# Patient Record
Sex: Female | Born: 1985 | Race: Black or African American | Hispanic: No | Marital: Single | Smoking: Current every day smoker
Health system: Southern US, Community
[De-identification: ages and names within clinical notes are randomized; demographics above are authoritative.]

## PROBLEM LIST (undated history)

## (undated) DIAGNOSIS — F419 Anxiety disorder, unspecified: Secondary | ICD-10-CM

## (undated) DIAGNOSIS — F1020 Alcohol dependence, uncomplicated: Secondary | ICD-10-CM

## (undated) HISTORY — PX: DILATION AND CURETTAGE OF UTERUS: SHX78

---
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
## 2009-04-15 ENCOUNTER — Emergency Department (HOSPITAL_COMMUNITY): Admission: EM | Admit: 2009-04-15 | Discharge: 2009-04-15 | Payer: Self-pay | Admitting: Emergency Medicine

## 2010-09-01 LAB — URINALYSIS, ROUTINE W REFLEX MICROSCOPIC
Bilirubin Urine: NEGATIVE
Glucose, UA: NEGATIVE mg/dL
Hgb urine dipstick: NEGATIVE
Ketones, ur: NEGATIVE mg/dL
Nitrite: NEGATIVE
Protein, ur: NEGATIVE mg/dL
Specific Gravity, Urine: 1.034 — ABNORMAL HIGH (ref 1.005–1.030)
Urobilinogen, UA: 1 mg/dL (ref 0.0–1.0)
pH: 7 (ref 5.0–8.0)

## 2010-09-01 LAB — CBC
HCT: 40.3 % (ref 36.0–46.0)
Hemoglobin: 13.4 g/dL (ref 12.0–15.0)
MCHC: 33.2 g/dL (ref 30.0–36.0)
MCV: 90.2 fL (ref 78.0–100.0)
Platelets: 241 10*3/uL (ref 150–400)
RBC: 4.47 MIL/uL (ref 3.87–5.11)
RDW: 12.8 % (ref 11.5–15.5)
WBC: 12.3 10*3/uL — ABNORMAL HIGH (ref 4.0–10.5)

## 2010-09-01 LAB — DIFFERENTIAL
Basophils Absolute: 0 10*3/uL (ref 0.0–0.1)
Basophils Relative: 0 % (ref 0–1)
Eosinophils Absolute: 0.1 10*3/uL (ref 0.0–0.7)
Eosinophils Relative: 1 % (ref 0–5)
Neutrophils Relative %: 87 % — ABNORMAL HIGH (ref 43–77)

## 2010-09-01 LAB — COMPREHENSIVE METABOLIC PANEL
ALT: 14 U/L (ref 0–35)
AST: 15 U/L (ref 0–37)
CO2: 25 mEq/L (ref 19–32)
Chloride: 100 mEq/L (ref 96–112)
GFR calc Af Amer: >60 mL/min (ref >60)
GFR calc non Af Amer: >60 mL/min (ref >60)
Glucose, Bld: 108 mg/dL — ABNORMAL HIGH (ref 70–99)
Sodium: 133 mEq/L — ABNORMAL LOW (ref 135–145)
Total Bilirubin: 0.7 mg/dL (ref 0.3–1.2)

## 2010-09-01 LAB — RAPID STREP SCREEN (MED CTR MEBANE ONLY): Streptococcus, Group A Screen (Direct): NEGATIVE

## 2010-09-01 LAB — POCT PREGNANCY, URINE: Preg Test, Ur: NEGATIVE
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
## 2010-10-15 NOTE — Op Note (Signed)
   Doris Gonzalez, Doris Gonzalez                        ACCOUNT NO.:  [id number]   MEDICAL RECORD NO.:  [id number]                   PATIENT TYPE:  AMB   LOCATION:  [facility]                                  [facility]   PHYSICIAN:  Malva Limes, M.D.                 DATE OF BIRTH:  [birth year]   DATE OF PROCEDURE:  01/23/2003  DATE OF DISCHARGE:                                 OPERATIVE REPORT   PREOPERATIVE DIAGNOSIS:  Second trimester fetal demise.   POSTOPERATIVE DIAGNOSIS:  Second trimester fetal demise.   PROCEDURE:  Dilation and evacuation.   SURGEON:  Malva Limes, M.D.   ANESTHESIA:  General.   ANTIBIOTICS:  Ancef 1 g.   DRAINS:  Red rubber catheter to bladder.   SPECIMENS:  Products of conception sent for pathology.   COMPLICATIONS:  None.   ESTIMATED BLOOD LOSS:  50 mL.   DESCRIPTION OF PROCEDURE:  The patient was taken to the operating room,  where she was placed in the dorsal supine position.  A general anesthetic  was administered without complications.  She was then placed in the dorsal  lithotomy position.  The patient was prepped with Hibiclens and her bladder  was drained with a red rubber catheter.  A previously-placed sponge and  laminaria were removed.  The patient was then draped in the usual fashion  for this procedure.  A sterile speculum was placed in the vagina, a single-  tooth tenaculum was applied to the anterior cervical lip.  The cervix was  dilated to a 37 [place].  A 14 mm suction cannula was placed through the  uterine cavity, the amniotic sac was ruptured and the fluid was noted to be  brown and turbid.  The products of conception was brown.  Sharp curettage  was then performed, followed by repeat suction.  The specimen was  examined and all parts of the fetus were identified.  This concluded the  procedure.  The patient was taken to the recovery room in stable condition.  She desired Depo-Provera prior to discharge.  She will be discharged to  home  with Keflex 500 mg q.i.d. for two days, Methergine 0.2 mg p.o. q.6h., and  Anaprox Double Strength p.r.n.                                               Malva Limes, M.D.    MA/MEDQ  D:  01/23/2003  T:  01/23/2003  Job:  [id number]   cc:   Clement Husbands, M.D.  [address]  [place]  [place] [address]  Fax: [phone]   Conni Elliot, M.D.  [address]  [place]  [place] [address]  Fax: [phone]

## 2016-06-27 DIAGNOSIS — Z720 Tobacco use: Secondary | ICD-10-CM | POA: Insufficient documentation

## 2016-06-27 DIAGNOSIS — F329 Major depressive disorder, single episode, unspecified: Secondary | ICD-10-CM | POA: Insufficient documentation

## 2016-06-27 DIAGNOSIS — Z7289 Other problems related to lifestyle: Secondary | ICD-10-CM | POA: Insufficient documentation

## 2016-06-27 DIAGNOSIS — F1910 Other psychoactive substance abuse, uncomplicated: Secondary | ICD-10-CM | POA: Insufficient documentation

## 2016-06-27 DIAGNOSIS — B182 Chronic viral hepatitis C: Secondary | ICD-10-CM | POA: Insufficient documentation

## 2016-06-27 DIAGNOSIS — F1010 Alcohol abuse, uncomplicated: Secondary | ICD-10-CM | POA: Insufficient documentation

## 2016-12-03 ENCOUNTER — Emergency Department (HOSPITAL_COMMUNITY): Payer: Medicaid - Out of State

## 2016-12-03 ENCOUNTER — Emergency Department (HOSPITAL_COMMUNITY)
Admission: EM | Admit: 2016-12-03 | Discharge: 2016-12-04 | Disposition: A | Discharge status: Home / Self Care | Payer: Medicaid - Out of State | Attending: Emergency Medicine | Admitting: Emergency Medicine

## 2016-12-03 DIAGNOSIS — S90511A Abrasion, right ankle, initial encounter: Secondary | ICD-10-CM | POA: Insufficient documentation

## 2016-12-03 DIAGNOSIS — S81811A Laceration without foreign body, right lower leg, initial encounter: Secondary | ICD-10-CM | POA: Insufficient documentation

## 2016-12-03 DIAGNOSIS — Y998 Other external cause status: Secondary | ICD-10-CM | POA: Diagnosis not present

## 2016-12-03 DIAGNOSIS — F17200 Nicotine dependence, unspecified, uncomplicated: Secondary | ICD-10-CM | POA: Insufficient documentation

## 2016-12-03 DIAGNOSIS — Y92410 Unspecified street and highway as the place of occurrence of the external cause: Secondary | ICD-10-CM | POA: Insufficient documentation

## 2016-12-03 DIAGNOSIS — S80811A Abrasion, right lower leg, initial encounter: Secondary | ICD-10-CM | POA: Diagnosis not present

## 2016-12-03 DIAGNOSIS — Y9389 Activity, other specified: Secondary | ICD-10-CM | POA: Diagnosis not present

## 2016-12-03 LAB — I-STAT BETA HCG BLOOD, ED (MC, WL, AP ONLY): I-stat hCG, quantitative: <5 m[IU]/mL (ref <5)

## 2016-12-03 LAB — CBC WITH DIFFERENTIAL/PLATELET
Basophils Absolute: 0 10*3/uL (ref 0.0–0.1)
Basophils Relative: 0 %
EOS ABS: 0 10*3/uL (ref 0.0–0.7)
Eosinophils Relative: 0 %
HEMATOCRIT: 40.5 % (ref 36.0–46.0)
HEMOGLOBIN: 13.7 g/dL (ref 12.0–15.0)
LYMPHS ABS: 2 10*3/uL (ref 0.7–4.0)
Lymphocytes Relative: 29 %
MCH: 30.1 pg (ref 26.0–34.0)
MCHC: 33.8 g/dL (ref 30.0–36.0)
MCV: 89 fL (ref 78.0–100.0)
MONOS PCT: 3 %
Monocytes Absolute: 0.2 10*3/uL (ref 0.1–1.0)
NEUTROS PCT: 68 %
Neutro Abs: 4.8 10*3/uL (ref 1.7–7.7)
Platelets: 274 10*3/uL (ref 150–400)
RBC: 4.55 MIL/uL (ref 3.87–5.11)
RDW: 12.1 % (ref 11.5–15.5)
WBC: 7.1 10*3/uL (ref 4.0–10.5)

## 2016-12-03 LAB — BASIC METABOLIC PANEL
Anion gap: 10 (ref 5–15)
BUN: 9 mg/dL (ref 6–20)
CHLORIDE: 108 mmol/L (ref 101–111)
CO2: 22 mmol/L (ref 22–32)
CREATININE: 0.96 mg/dL (ref 0.44–1.00)
Calcium: 9 mg/dL (ref 8.9–10.3)
GFR calc Af Amer: >60 mL/min (ref >60)
GFR calc non Af Amer: >60 mL/min (ref >60)
GLUCOSE: 148 mg/dL — AB (ref 65–99)
POTASSIUM: 4.1 mmol/L (ref 3.5–5.1)
SODIUM: 140 mmol/L (ref 135–145)

## 2016-12-03 MED ORDER — LORAZEPAM 2 MG/ML IJ SOLN
2.0000 mg | Freq: Once | INTRAMUSCULAR | Status: AC
  Administered 2016-12-03: 2 mg via INTRAMUSCULAR
  Filled 2016-12-03: qty 1

## 2016-12-03 MED ORDER — SODIUM CHLORIDE 0.9 % IV BOLUS (SEPSIS)
1000.0000 mL | Freq: Once | INTRAVENOUS | Status: AC
  Administered 2016-12-03: 1000 mL via INTRAVENOUS

## 2016-12-03 MED ORDER — TETANUS-DIPHTH-ACELL PERTUSSIS 5-2.5-18.5 LF-MCG/0.5 IM SUSP
0.5000 mL | Freq: Once | INTRAMUSCULAR | Status: AC
  Administered 2016-12-03: 0.5 mL via INTRAMUSCULAR
  Filled 2016-12-03: qty 0.5

## 2016-12-03 MED ORDER — LIDOCAINE-EPINEPHRINE (PF) 2 %-1:200000 IJ SOLN
20.0000 mL | Freq: Once | INTRAMUSCULAR | Status: AC
  Administered 2016-12-03: 20 mL
  Filled 2016-12-03: qty 20

## 2016-12-03 MED ORDER — FENTANYL CITRATE (PF) 100 MCG/2ML IJ SOLN
50.0000 ug | Freq: Once | INTRAMUSCULAR | Status: AC
  Administered 2016-12-03: 50 ug via INTRAVENOUS
  Filled 2016-12-03: qty 2
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
## 2016-12-04 ENCOUNTER — Encounter (HOSPITAL_COMMUNITY): Payer: Self-pay | Admitting: Emergency Medicine

## 2016-12-04 ENCOUNTER — Emergency Department (HOSPITAL_COMMUNITY)
Admission: EM | Admit: 2016-12-04 | Discharge: 2016-12-04 | Disposition: A | Discharge status: Home / Self Care | Payer: Medicaid - Out of State | Source: Home / Self Care | Attending: Physician Assistant | Admitting: Physician Assistant

## 2016-12-04 DIAGNOSIS — S81811D Laceration without foreign body, right lower leg, subsequent encounter: Secondary | ICD-10-CM

## 2016-12-04 DIAGNOSIS — Y998 Other external cause status: Secondary | ICD-10-CM | POA: Insufficient documentation

## 2016-12-04 DIAGNOSIS — S90511A Abrasion, right ankle, initial encounter: Secondary | ICD-10-CM | POA: Insufficient documentation

## 2016-12-04 DIAGNOSIS — S80811A Abrasion, right lower leg, initial encounter: Secondary | ICD-10-CM

## 2016-12-04 DIAGNOSIS — Y9389 Activity, other specified: Secondary | ICD-10-CM | POA: Insufficient documentation

## 2016-12-04 DIAGNOSIS — F17200 Nicotine dependence, unspecified, uncomplicated: Secondary | ICD-10-CM | POA: Insufficient documentation

## 2016-12-04 DIAGNOSIS — Y92410 Unspecified street and highway as the place of occurrence of the external cause: Secondary | ICD-10-CM | POA: Insufficient documentation

## 2016-12-04 MED ORDER — CEPHALEXIN 250 MG PO CAPS
500.0000 mg | ORAL_CAPSULE | Freq: Once | ORAL | Status: AC
  Administered 2016-12-04: 500 mg via ORAL
  Filled 2016-12-04: qty 2

## 2016-12-04 MED ORDER — CEPHALEXIN 500 MG PO CAPS: 500.0000 mg | ORAL_CAPSULE | Freq: Four times a day (QID) | ORAL | 0 refills | Status: DC

## 2016-12-04 MED ORDER — BACITRACIN ZINC 500 UNIT/GM EX OINT
TOPICAL_OINTMENT | Freq: Two times a day (BID) | CUTANEOUS | Status: DC
  Administered 2016-12-04: 1 via TOPICAL
  Filled 2016-12-04: qty 0.9

## 2016-12-04 MED ORDER — LORAZEPAM 2 MG/ML IJ SOLN
2.0000 mg | Freq: Once | INTRAMUSCULAR | Status: AC
  Administered 2016-12-04: 2 mg via INTRAVENOUS
  Filled 2016-12-04: qty 1

## 2016-12-04 MED ORDER — OXYCODONE-ACETAMINOPHEN 5-325 MG PO TABS: 1.0000 | ORAL_TABLET | Freq: Four times a day (QID) | ORAL | 0 refills | Status: DC | PRN

## 2016-12-04 MED ORDER — OXYCODONE-ACETAMINOPHEN 5-325 MG PO TABS
1.0000 | ORAL_TABLET | Freq: Once | ORAL | Status: AC
  Administered 2016-12-04: 1 via ORAL
  Filled 2016-12-04: qty 1

## 2016-12-04 MED ORDER — NAPROXEN 500 MG PO TABS: 500.0000 mg | ORAL_TABLET | Freq: Two times a day (BID) | ORAL | 0 refills | Status: DC
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
## 2016-12-04 NOTE — Discharge Instructions (Signed)
Please read and follow all provided instructions.  Your diagnoses today include:  1. Laceration of right lower leg, subsequent encounter   2. Abrasion of right lower leg, initial encounter     Tests performed today include:  Vital signs. See below for your results today.   Medications prescribed:   Percocet (oxycodone/acetaminophen) - narcotic pain medication  DO NOT drive or perform any activities that require you to be awake and alert because this medicine can make you drowsy. BE VERY CAREFUL not to take multiple medicines containing Tylenol (also called acetaminophen). Doing so can lead to an overdose which can damage your liver and cause liver failure and possibly death.   Naproxen - anti-inflammatory pain medication  Do not exceed 500mg  naproxen every 12 hours, take with food  You have been prescribed an anti-inflammatory medication or NSAID. Take with food. Take smallest effective dose for the shortest duration needed for your pain. Stop taking if you experience stomach pain or vomiting.   Take any prescribed medications only as directed.   Home care instructions:  Follow any educational materials and wound care instructions contained in this packet.   Keep affected area above the level of your heart when possible to minimize swelling. Wash area gently twice a day with warm soapy water. Do not apply alcohol or hydrogen peroxide. Cover the area if it draining or weeping.   Return instructions:  Return to the Emergency Department if you have:  Fever  Worsening pain  Worsening swelling of the wound  Pus draining from the wound  Redness of the skin that moves away from the wound, especially if it streaks away from the affected area   Any other emergent concerns  Your vital signs today were: Pulse (!) 105    Temp 99 F (37.2 C) (Oral)    Resp 20    LMP 12/03/2016    SpO2 100%  If your blood pressure (BP) was elevated above 135/85 this visit, please have this repeated  by your doctor within one month. --------------
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
## 2016-12-04 NOTE — ED Notes (Signed)
Pt more awake after eating meal and several cups of water, pt up to bathroom via wheel chair, verbalizes discharge instructions

## 2016-12-10 ENCOUNTER — Emergency Department (HOSPITAL_COMMUNITY)
Admission: EM | Admit: 2016-12-10 | Discharge: 2016-12-10 | Disposition: A | Discharge status: Home / Self Care | Payer: Medicaid - Out of State | Attending: Emergency Medicine | Admitting: Emergency Medicine

## 2016-12-10 ENCOUNTER — Encounter (HOSPITAL_COMMUNITY): Payer: Self-pay

## 2016-12-10 DIAGNOSIS — M79604 Pain in right leg: Secondary | ICD-10-CM

## 2016-12-10 DIAGNOSIS — S81801D Unspecified open wound, right lower leg, subsequent encounter: Secondary | ICD-10-CM

## 2016-12-10 DIAGNOSIS — S81811D Laceration without foreign body, right lower leg, subsequent encounter: Secondary | ICD-10-CM | POA: Insufficient documentation

## 2016-12-10 DIAGNOSIS — W228XXA Striking against or struck by other objects, initial encounter: Secondary | ICD-10-CM | POA: Insufficient documentation

## 2016-12-10 LAB — CBC WITH DIFFERENTIAL/PLATELET
Basophils Absolute: 0 10*3/uL (ref 0.0–0.1)
Basophils Relative: 0 %
Eosinophils Absolute: 0.2 10*3/uL (ref 0.0–0.7)
Eosinophils Relative: 2 %
HEMATOCRIT: 32.7 % — AB (ref 36.0–46.0)
HEMOGLOBIN: 10.9 g/dL — AB (ref 12.0–15.0)
LYMPHS ABS: 2.5 10*3/uL (ref 0.7–4.0)
LYMPHS PCT: 30 %
MCH: 29.9 pg (ref 26.0–34.0)
MCHC: 33.3 g/dL (ref 30.0–36.0)
MCV: 89.8 fL (ref 78.0–100.0)
MONO ABS: 0.9 10*3/uL (ref 0.1–1.0)
MONOS PCT: 11 %
NEUTROS ABS: 4.8 10*3/uL (ref 1.7–7.7)
NEUTROS PCT: 57 %
Platelets: 346 10*3/uL (ref 150–400)
RBC: 3.64 MIL/uL — ABNORMAL LOW (ref 3.87–5.11)
RDW: 12 % (ref 11.5–15.5)
WBC: 8.3 10*3/uL (ref 4.0–10.5)

## 2016-12-10 LAB — COMPREHENSIVE METABOLIC PANEL
ALBUMIN: 3.2 g/dL — AB (ref 3.5–5.0)
ALK PHOS: 40 U/L (ref 38–126)
ALT: 30 U/L (ref 14–54)
ANION GAP: 9 (ref 5–15)
AST: 36 U/L (ref 15–41)
BUN: 5 mg/dL — ABNORMAL LOW (ref 6–20)
CALCIUM: 9 mg/dL (ref 8.9–10.3)
CHLORIDE: 106 mmol/L (ref 101–111)
CO2: 21 mmol/L — AB (ref 22–32)
Creatinine, Ser: 0.73 mg/dL (ref 0.44–1.00)
GFR calc non Af Amer: >60 mL/min (ref >60)
GLUCOSE: 103 mg/dL — AB (ref 65–99)
Potassium: 4 mmol/L (ref 3.5–5.1)
Sodium: 136 mmol/L (ref 135–145)
Total Bilirubin: 0.7 mg/dL (ref 0.3–1.2)
Total Protein: 7 g/dL (ref 6.5–8.1)

## 2016-12-10 LAB — I-STAT CG4 LACTIC ACID, ED: LACTIC ACID, VENOUS: 1.58 mmol/L (ref 0.5–1.9)

## 2016-12-10 MED ORDER — OXYCODONE-ACETAMINOPHEN 5-325 MG PO TABS: 1.0000 | ORAL_TABLET | ORAL | Status: DC | PRN

## 2016-12-10 MED ORDER — KETOROLAC TROMETHAMINE 30 MG/ML IJ SOLN
30.0000 mg | Freq: Once | INTRAMUSCULAR | Status: AC
  Administered 2016-12-10: 30 mg via INTRAMUSCULAR
  Filled 2016-12-10: qty 1

## 2016-12-10 MED ORDER — OXYCODONE-ACETAMINOPHEN 5-325 MG PO TABS
ORAL_TABLET | ORAL | Status: AC
  Administered 2016-12-10: 1
  Filled 2016-12-10: qty 1

## 2016-12-10 MED ORDER — MELOXICAM 7.5 MG PO TABS: 7.5000 mg | ORAL_TABLET | Freq: Every day | ORAL | 0 refills | Status: DC

## 2016-12-10 NOTE — ED Notes (Signed)
Charge RN called to room as patient and family member requesting narcotics for leg wound "Just until she sees primary care doctor." Chart reviewed, pt already received doses and RX of percocet, and refused tylenol/motrin. Explained to patient that PA & MD have elected to RX tylenol or motrin, and pt will not be receiving any more narcotics from ED today. Pt's family member stating "that's negligence." Explained to family that our physicians and ED as a whole have elected not to prescribe narcotics to reduce overuse/overdose and promote patient safety. Explained that refusing to prescribe narcotics is not negligence, PA is not giving them what they want as she has deemed its not necessary. Family upset that staff is asking them to sign for treatment and they received nothing. Explained to patient's family that this is not the case, she has been seen by PA-C and nursing staff - not getting what you want is not the same as not getting treatment. Pt interjecting stating she's been in ED for five hours, would like her leg wrapped and that she wants to go. Notified PA-C and primary nurse.  Ian Bushman, Agricultural consultant
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
## 2016-12-10 NOTE — Discharge Instructions (Signed)
Please take tylenol, up to 1,000 mg (two extra strength pills) every 8 hours.  Do not take more than 3,000 mg tylenol in a 24 hour period.  Please check all medication labels as many medications such as pain and cold medications may contain tylenol.  Do not drink alcohol while taking these medications.  Do not take other NSAID'S while taking mobic(such as aleve or naproxen).  Please take mobic with food to decrease stomach upset.  Please call the number listed in the black box to obtain a primary care provider.
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
## 2016-12-10 NOTE — ED Notes (Signed)
Patient requesting to see a phlebotomist, the charge nurse, and the attending provider.  PA made aware.  Charge RN made aware.

## 2016-12-13 ENCOUNTER — Ambulatory Visit (HOSPITAL_COMMUNITY)
Admission: EM | Admit: 2016-12-13 | Discharge: 2016-12-13 | Disposition: A | Discharge status: Home / Self Care | Payer: Medicaid - Out of State | Attending: Family | Admitting: Family

## 2016-12-13 ENCOUNTER — Encounter (HOSPITAL_COMMUNITY): Payer: Self-pay | Admitting: Emergency Medicine

## 2016-12-13 DIAGNOSIS — Z4889 Encounter for other specified surgical aftercare: Secondary | ICD-10-CM

## 2016-12-13 DIAGNOSIS — Z5189 Encounter for other specified aftercare: Secondary | ICD-10-CM

## 2016-12-13 DIAGNOSIS — Z4802 Encounter for removal of sutures: Secondary | ICD-10-CM

## 2016-12-13 DIAGNOSIS — S80811A Abrasion, right lower leg, initial encounter: Secondary | ICD-10-CM

## 2016-12-13 MED ORDER — TRAMADOL HCL 50 MG PO TABS: 50.0000 mg | ORAL_TABLET | Freq: Four times a day (QID) | ORAL | 0 refills | Status: DC | PRN

## 2016-12-13 MED ORDER — DICLOFENAC SODIUM 75 MG PO TBEC: 75.0000 mg | DELAYED_RELEASE_TABLET | Freq: Two times a day (BID) | ORAL | 0 refills | Status: DC | PRN
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
## 2016-12-13 NOTE — ED Triage Notes (Signed)
Patient has a sutured wound to be evaluated, swelling in right lower leg.  Patient continues to stress how painful this injury is.  Patient reports little improvement, but some slightly

## 2016-12-13 NOTE — Discharge Instructions (Signed)
Recommend continue to keep area covered with non-stick gauze. About 10 sutures were removed today but most of your sutures were still in place to allow for further healing of your wound. May take Tramadol 50mg  - 1 tablet every 6 hours as needed for severe pain. Continue Voltaren 75mg  twice a day as needed for pain and swelling. Call the [facility] tomorrow to schedule appointment to be seen as soon as possible for recheck.

## 2016-12-18 ENCOUNTER — Emergency Department (HOSPITAL_COMMUNITY)
Admission: EM | Admit: 2016-12-18 | Discharge: 2016-12-18 | Disposition: A | Discharge status: Home / Self Care | Payer: Medicaid - Out of State | Attending: Emergency Medicine | Admitting: Emergency Medicine

## 2016-12-18 ENCOUNTER — Encounter (HOSPITAL_COMMUNITY): Payer: Self-pay | Admitting: Emergency Medicine

## 2016-12-18 DIAGNOSIS — F17200 Nicotine dependence, unspecified, uncomplicated: Secondary | ICD-10-CM | POA: Insufficient documentation

## 2016-12-18 DIAGNOSIS — Z5189 Encounter for other specified aftercare: Secondary | ICD-10-CM | POA: Insufficient documentation

## 2016-12-18 DIAGNOSIS — Z4802 Encounter for removal of sutures: Secondary | ICD-10-CM | POA: Insufficient documentation

## 2016-12-18 MED ORDER — OXYCODONE-ACETAMINOPHEN 5-325 MG PO TABS: 1.0000 | ORAL_TABLET | Freq: Four times a day (QID) | ORAL | 0 refills | Status: DC | PRN

## 2016-12-18 MED ORDER — OXYCODONE-ACETAMINOPHEN 5-325 MG PO TABS
1.0000 | ORAL_TABLET | Freq: Once | ORAL | Status: AC
  Administered 2016-12-18: 1 via ORAL
  Filled 2016-12-18: qty 1

## 2016-12-18 MED ORDER — IBUPROFEN 600 MG PO TABS: 600.0000 mg | ORAL_TABLET | Freq: Four times a day (QID) | ORAL | 0 refills | Status: DC | PRN

## 2016-12-18 NOTE — Discharge Instructions (Signed)
Use nonstick dressings. Change the dressing at least twice a day, or whenever soiled. Follow up with the wound care center, as planned, for any further management of this issue. Tylenol, ibuprofen, or naproxen for pain. Percocet for severe pain. Do not drive or perform other dangerous activities while taking the Percocet.
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
## 2016-12-18 NOTE — ED Notes (Signed)
PA at bedside.

## 2016-12-23 ENCOUNTER — Encounter (HOSPITAL_BASED_OUTPATIENT_CLINIC_OR_DEPARTMENT_OTHER): Payer: Medicaid - Out of State

## 2016-12-23 ENCOUNTER — Emergency Department (HOSPITAL_COMMUNITY)
Admission: EM | Admit: 2016-12-23 | Discharge: 2016-12-23 | Disposition: A | Discharge status: Home / Self Care | Payer: Medicaid - Out of State | Attending: Emergency Medicine | Admitting: Emergency Medicine

## 2016-12-23 ENCOUNTER — Encounter (HOSPITAL_COMMUNITY): Payer: Self-pay | Admitting: Emergency Medicine

## 2016-12-23 DIAGNOSIS — F17200 Nicotine dependence, unspecified, uncomplicated: Secondary | ICD-10-CM | POA: Diagnosis not present

## 2016-12-23 DIAGNOSIS — Y939 Activity, unspecified: Secondary | ICD-10-CM | POA: Insufficient documentation

## 2016-12-23 DIAGNOSIS — Y33XXXA Other specified events, undetermined intent, initial encounter: Secondary | ICD-10-CM | POA: Diagnosis not present

## 2016-12-23 DIAGNOSIS — Y999 Unspecified external cause status: Secondary | ICD-10-CM | POA: Diagnosis not present

## 2016-12-23 DIAGNOSIS — Y929 Unspecified place or not applicable: Secondary | ICD-10-CM | POA: Diagnosis not present

## 2016-12-23 DIAGNOSIS — S81811D Laceration without foreign body, right lower leg, subsequent encounter: Secondary | ICD-10-CM | POA: Insufficient documentation

## 2016-12-23 DIAGNOSIS — T148XXD Other injury of unspecified body region, subsequent encounter: Secondary | ICD-10-CM

## 2016-12-23 MED ORDER — KETOROLAC TROMETHAMINE 30 MG/ML IJ SOLN
30.0000 mg | Freq: Once | INTRAMUSCULAR | Status: DC
  Filled 2016-12-23: qty 1

## 2016-12-23 MED ORDER — ACETAMINOPHEN 500 MG PO TABS: 1000.0000 mg | ORAL_TABLET | Freq: Three times a day (TID) | ORAL | 0 refills | Status: AC

## 2016-12-23 MED ORDER — OXYCODONE-ACETAMINOPHEN 5-325 MG PO TABS: 1.0000 | ORAL_TABLET | Freq: Three times a day (TID) | ORAL | 0 refills | Status: AC | PRN

## 2016-12-23 MED ORDER — CEPHALEXIN 500 MG PO CAPS
500.0000 mg | ORAL_CAPSULE | Freq: Once | ORAL | Status: AC
  Administered 2016-12-23: 500 mg via ORAL
  Filled 2016-12-23: qty 1

## 2016-12-23 MED ORDER — IBUPROFEN 600 MG PO TABS: 600.0000 mg | ORAL_TABLET | Freq: Four times a day (QID) | ORAL | 0 refills | Status: DC | PRN

## 2016-12-23 MED ORDER — OXYCODONE-ACETAMINOPHEN 5-325 MG PO TABS
1.0000 | ORAL_TABLET | Freq: Once | ORAL | Status: AC
  Administered 2016-12-23: 1 via ORAL
  Filled 2016-12-23: qty 1

## 2016-12-23 MED ORDER — MORPHINE SULFATE (PF) 2 MG/ML IV SOLN
2.0000 mg | Freq: Once | INTRAVENOUS | Status: DC
  Filled 2016-12-23: qty 1

## 2016-12-23 MED ORDER — CEPHALEXIN 500 MG PO CAPS: 500.0000 mg | ORAL_CAPSULE | Freq: Three times a day (TID) | ORAL | 0 refills | Status: AC

## 2016-12-23 MED ORDER — IBUPROFEN 800 MG PO TABS
800.0000 mg | ORAL_TABLET | Freq: Once | ORAL | Status: AC
  Administered 2016-12-23: 800 mg via ORAL
  Filled 2016-12-23: qty 1
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
## 2016-12-23 NOTE — ED Notes (Signed)
Bed: KI21 Expected date:  Expected time:  Means of arrival:  Comments: EMS-abrasions

## 2016-12-23 NOTE — Discharge Instructions (Signed)
Apply a wet gauze to the wound and rapid loosely with an Ace bandage. Please change since at least 3-4 times a day.

## 2016-12-23 NOTE — ED Notes (Signed)
Wet to dry dressing and ace wrap applied. Pt and family educated on wound care and dressing change by MD and points also reviewed by this RN.

## 2016-12-23 NOTE — ED Triage Notes (Signed)
Pt from PCP, co right leg cellulitis , sent to ED for surgery consult. Pt co right leg pain . Presents with dressing on right leg affected area

## 2016-12-26 ENCOUNTER — Encounter (HOSPITAL_BASED_OUTPATIENT_CLINIC_OR_DEPARTMENT_OTHER): Payer: Medicaid - Out of State | Attending: Internal Medicine

## 2016-12-26 DIAGNOSIS — F17210 Nicotine dependence, cigarettes, uncomplicated: Secondary | ICD-10-CM | POA: Insufficient documentation

## 2016-12-26 DIAGNOSIS — S81811A Laceration without foreign body, right lower leg, initial encounter: Secondary | ICD-10-CM | POA: Diagnosis not present

## 2016-12-29 ENCOUNTER — Encounter (HOSPITAL_BASED_OUTPATIENT_CLINIC_OR_DEPARTMENT_OTHER): Payer: Medicaid - Out of State | Attending: Internal Medicine

## 2016-12-29 DIAGNOSIS — B9620 Unspecified Escherichia coli [E. coli] as the cause of diseases classified elsewhere: Secondary | ICD-10-CM | POA: Insufficient documentation

## 2016-12-29 DIAGNOSIS — B964 Proteus (mirabilis) (morganii) as the cause of diseases classified elsewhere: Secondary | ICD-10-CM | POA: Insufficient documentation

## 2016-12-29 DIAGNOSIS — L97213 Non-pressure chronic ulcer of right calf with necrosis of muscle: Secondary | ICD-10-CM | POA: Insufficient documentation

## 2016-12-29 DIAGNOSIS — B9562 Methicillin resistant Staphylococcus aureus infection as the cause of diseases classified elsewhere: Secondary | ICD-10-CM | POA: Insufficient documentation

## 2017-01-02 ENCOUNTER — Encounter (HOSPITAL_BASED_OUTPATIENT_CLINIC_OR_DEPARTMENT_OTHER): Payer: No Typology Code available for payment source

## 2017-01-16 ENCOUNTER — Other Ambulatory Visit (HOSPITAL_COMMUNITY)
Admission: RE | Admit: 2017-01-16 | Discharge: 2017-01-16 | Disposition: A | Discharge status: Home / Self Care | Payer: Medicaid - Out of State | Source: Other Acute Inpatient Hospital | Attending: Internal Medicine | Admitting: Internal Medicine

## 2017-01-16 DIAGNOSIS — L97213 Non-pressure chronic ulcer of right calf with necrosis of muscle: Secondary | ICD-10-CM | POA: Insufficient documentation

## 2017-01-21 LAB — AEROBIC CULTURE  (SUPERFICIAL SPECIMEN)

## 2017-01-21 LAB — AEROBIC CULTURE W GRAM STAIN (SUPERFICIAL SPECIMEN)

## 2017-01-22 ENCOUNTER — Emergency Department (HOSPITAL_COMMUNITY)
Admission: EM | Admit: 2017-01-22 | Discharge: 2017-01-22 | Disposition: A | Discharge status: Home / Self Care | Payer: Medicaid - Out of State | Attending: Emergency Medicine | Admitting: Emergency Medicine

## 2017-01-22 ENCOUNTER — Encounter (HOSPITAL_COMMUNITY): Payer: Self-pay | Admitting: Emergency Medicine

## 2017-01-22 DIAGNOSIS — Z79899 Other long term (current) drug therapy: Secondary | ICD-10-CM | POA: Insufficient documentation

## 2017-01-22 DIAGNOSIS — Z5189 Encounter for other specified aftercare: Secondary | ICD-10-CM

## 2017-01-22 DIAGNOSIS — Y929 Unspecified place or not applicable: Secondary | ICD-10-CM | POA: Insufficient documentation

## 2017-01-22 DIAGNOSIS — X58XXXA Exposure to other specified factors, initial encounter: Secondary | ICD-10-CM | POA: Insufficient documentation

## 2017-01-22 DIAGNOSIS — F17200 Nicotine dependence, unspecified, uncomplicated: Secondary | ICD-10-CM | POA: Insufficient documentation

## 2017-01-22 DIAGNOSIS — S81802A Unspecified open wound, left lower leg, initial encounter: Secondary | ICD-10-CM | POA: Insufficient documentation

## 2017-01-22 DIAGNOSIS — Y939 Activity, unspecified: Secondary | ICD-10-CM | POA: Insufficient documentation

## 2017-01-22 DIAGNOSIS — Z4800 Encounter for change or removal of nonsurgical wound dressing: Secondary | ICD-10-CM | POA: Insufficient documentation

## 2017-01-22 DIAGNOSIS — Y999 Unspecified external cause status: Secondary | ICD-10-CM | POA: Insufficient documentation

## 2017-01-22 DIAGNOSIS — M791 Myalgia: Secondary | ICD-10-CM | POA: Insufficient documentation

## 2017-01-22 LAB — CBC WITH DIFFERENTIAL/PLATELET
BASOS PCT: 1 %
Basophils Absolute: 0 10*3/uL (ref 0.0–0.1)
EOS ABS: 0.1 10*3/uL (ref 0.0–0.7)
Eosinophils Relative: 1 %
HCT: 38.4 % (ref 36.0–46.0)
HEMOGLOBIN: 13.1 g/dL (ref 12.0–15.0)
LYMPHS ABS: 1.5 10*3/uL (ref 0.7–4.0)
Lymphocytes Relative: 27 %
MCH: 30.5 pg (ref 26.0–34.0)
MCHC: 34.1 g/dL (ref 30.0–36.0)
MCV: 89.3 fL (ref 78.0–100.0)
Monocytes Absolute: 0.4 10*3/uL (ref 0.1–1.0)
Monocytes Relative: 7 %
NEUTROS ABS: 3.5 10*3/uL (ref 1.7–7.7)
NEUTROS PCT: 64 %
Platelets: 466 10*3/uL — ABNORMAL HIGH (ref 150–400)
RBC: 4.3 MIL/uL (ref 3.87–5.11)
RDW: 13 % (ref 11.5–15.5)
WBC: 5.4 10*3/uL (ref 4.0–10.5)

## 2017-01-22 LAB — I-STAT CG4 LACTIC ACID, ED: Lactic Acid, Venous: 1.06 mmol/L (ref 0.5–1.9)

## 2017-01-22 LAB — BASIC METABOLIC PANEL
ANION GAP: 7 (ref 5–15)
BUN: 11 mg/dL (ref 6–20)
CHLORIDE: 105 mmol/L (ref 101–111)
CO2: 25 mmol/L (ref 22–32)
Calcium: 9.3 mg/dL (ref 8.9–10.3)
Creatinine, Ser: 0.89 mg/dL (ref 0.44–1.00)
GFR calc non Af Amer: >60 mL/min (ref >60)
Glucose, Bld: 104 mg/dL — ABNORMAL HIGH (ref 65–99)
POTASSIUM: 4.1 mmol/L (ref 3.5–5.1)
SODIUM: 137 mmol/L (ref 135–145)

## 2017-01-22 MED ORDER — FLUCONAZOLE 150 MG PO TABS
150.0000 mg | ORAL_TABLET | Freq: Once | ORAL | Status: AC
  Administered 2017-01-22: 150 mg via ORAL
  Filled 2017-01-22: qty 1

## 2017-01-22 MED ORDER — OXYCODONE-ACETAMINOPHEN 5-325 MG PO TABS
2.0000 | ORAL_TABLET | Freq: Once | ORAL | Status: AC
  Administered 2017-01-22: 2 via ORAL
  Filled 2017-01-22: qty 2

## 2017-01-22 NOTE — Discharge Instructions (Addendum)
Please return to the previously recommended 3 times a day dressing changes using wet-to-dry technique. Continue taking the antibiotic, as prescribed.  May follow up with wound care center or a primary care provider on this matter. Return to the ED for worsening symptoms.

## 2017-01-22 NOTE — ED Notes (Signed)
Attempted lab draw pt would not sit still and kept jerking her arm away.

## 2017-01-22 NOTE — ED Provider Notes (Signed)
[facility] DEPT Provider Note   CSN: [id number] Arrival date & time: 01/22/17  1402     History   Chief Complaint Chief Complaint  Patient presents with  . Wound Check    HPI Doris Gonzalez is a 32 y.o. female.  HPI   Doris Gonzalez is a 31 y.o. female, presenting to the ED for wound check and possible infection with increased drainage and a foul smell for about two weeks. Pain is aching or throbbing, rated 7/10, nonradiating. She states she was previously changing the bandage with wet-dry dressings three times a day. She states she then went to the wound care center three weeks ago and was told to not change the bandage until her weekly wound care appointments.   States she was last seen at the wound care center on August 23 and the bandage was changed at that time. She states she was told not to change the bandage. She adds that a wound culture was performed and she was placed on 10 days of Keflex. She has another appointment tomorrow at 1:15pm or 2pm.  Almost right away, patient is requesting pain medication.  Denies fevers/chills, nausea/vomiting, increased pain, swelling, neuro deficits, or any other complaints.   History reviewed. No pertinent past medical history.  There are no active problems to display for this patient.   Past Surgical History:  Procedure Laterality Date  . DILATION AND CURETTAGE OF UTERUS      OB History    No data available       Home Medications    Prior to Admission medications   Medication Sig Start Date End Date Taking? Authorizing Provider  clarithromycin (BIAXIN) 500 MG tablet Take 500 mg by mouth 2 (two) times daily.   Yes [provider]  ibuprofen (ADVIL,MOTRIN) 200 MG tablet Take 600-800 mg by mouth every 8 (eight) hours as needed for headache, mild pain or moderate pain.   Yes [provider]  diclofenac (VOLTAREN) 75 MG EC tablet Take 1 tablet (75 mg total) by mouth 2 (two) times daily as needed for  moderate pain. Patient not taking: Reported on 12/23/2016 12/13/16   Katy Apo, NP  ibuprofen (ADVIL,MOTRIN) 600 MG tablet Take 1 tablet (600 mg total) by mouth every 6 (six) hours as needed. Patient not taking: Reported on 01/22/2017 12/23/16   Fatima Blank, MD  traMADol (ULTRAM) 50 MG tablet Take 1 tablet (50 mg total) by mouth every 6 (six) hours as needed for severe pain. Patient not taking: Reported on 12/23/2016 12/13/16   Katy Apo, NP    Family History No family history on file.  Social History Social History  Substance Use Topics  . Smoking status: Current Every Day Smoker  . Smokeless tobacco: Never Used  . Alcohol use Yes     Allergies   Shellfish allergy   Review of Systems Review of Systems  Constitutional: Negative for chills and fever.  Respiratory: Negative for shortness of breath.   Cardiovascular: Negative for chest pain.  Gastrointestinal: Negative for nausea and vomiting.  Musculoskeletal: Positive for myalgias.  Skin: Positive for wound.  Neurological: Negative for weakness and numbness.  All other systems reviewed and are negative.    Physical Exam Updated Vital Signs BP (!) 131/103 (BP Location: Left Arm)   Pulse 88   Temp 98.6 F (37 C) (Oral)   Resp 16   LMP 12/03/2016   SpO2 100%   Physical Exam  Constitutional: She appears well-developed and well-nourished. No  distress.  HENT:  Head: Normocephalic and atraumatic.  Eyes: Conjunctivae are normal.  Neck: Neck supple.  Cardiovascular: Normal rate, regular rhythm, normal heart sounds and intact distal pulses.   Pulmonary/Chest: Effort normal and breath sounds normal. No respiratory distress.  Abdominal: Soft. There is no tenderness. There is no guarding.  Musculoskeletal: She exhibits tenderness. She exhibits no edema.  Lymphadenopathy:    She has no cervical adenopathy.  Neurological: She is alert.  No sensory deficits in the left lower extremity. Plantar  dorsiflexion 5/5 on the left. Flexion and extension of the left knee 5/5.  Skin: Skin is warm and dry. Capillary refill takes less than 2 seconds. She is not diaphoretic.  Patient has a posterior left lower leg wound measuring approximately 10x9cm. There is granulation tissue throughout the base of the wound. The edges of the wound are pink and have excellent capillary refill. There is no active hemorrhage noted. There is what appears to be some possibly purulent appearing drainage on the bandage. There is no noted surrounding swelling or erythema. No pain out of proportion.  Psychiatric: She has a normal mood and affect. Her behavior is normal.  Nursing note and vitals reviewed.                ED Treatments / Results  Labs (all labs ordered are listed, but only abnormal results are displayed) Labs Reviewed  CBC WITH DIFFERENTIAL/PLATELET - Abnormal; Notable for the following:       Result Value   Platelets 466 (*)    All other components within normal limits  BASIC METABOLIC PANEL - Abnormal; Notable for the following:    Glucose, Bld 104 (*)    All other components within normal limits  I-STAT CG4 LACTIC ACID, ED    EKG  EKG Interpretation None       Radiology No results found.  Procedures Procedures (including critical care time)  Medications Ordered in ED Medications  oxyCODONE-acetaminophen (PERCOCET/ROXICET) 5-325 MG per tablet 2 tablet (2 tablets Oral Given 01/22/17 1704)  fluconazole (DIFLUCAN) tablet 150 mg (150 mg Oral Given 01/22/17 1856)     Initial Impression / Assessment and Plan / ED Course  I have reviewed the triage vital signs and the nursing notes.  Pertinent labs & imaging results that were available during my care of the patient were reviewed by me and considered in my medical decision making (see chart for details).  Clinical Course as of Jan 23 1931  Nancy Fetter Jan 22, 2017  1850 Patient is requesting diflucan before she leaves because she  says the ABX is giving her symptoms of a yeast infection.   [SJ]    Clinical Course User Index [SJ] Najae Rathert C, PA-C    This wound appears significantly improved from my evaluation in the ED on July 22. Patient is nontoxic appearing, afebrile, not tachycardic, not tachypneic, not hypotensive, maintains SPO2 of 100% on room air, and is in no apparent distress. Patient has no signs of sepsis or other serious or life-threatening condition.  I think this wound would benefit from more frequent dressing changes. I am recommending the patient return to her wet-to-dry 3 time a day dressing change regimen.  Patient is requesting pain medication. Review of the narcotic database reveals patient received 60 count (30 day supply) oxycodone 10 mg tablets on August 1. She states she is out of this medication. Patient was informed that she would not be getting any further narcotics from the ED as well as  the reasoning.   Findings and plan of care discussed with Tanna Furry, MD.   Vitals:   01/22/17 1427 01/22/17 1855  BP: (!) 131/103 (!) 132/94  Pulse: 88 62  Resp: 16   Temp: 98.6 F (37 C)   TempSrc: Oral   SpO2: 100% 100%     Final Clinical Impressions(s) / ED Diagnoses   Final diagnoses:  Visit for wound check    New Prescriptions Discharge Medication List as of 01/22/2017  6:19 PM       Lorayne Bender, PA-C 01/22/17 1932    Tanna Furry, MD 01/31/17 1443

## 2017-01-22 NOTE — ED Triage Notes (Addendum)
Pt from home with c/o of purulent drainage from right leg wound. Pt has been following up up with wound center for injury. Pt states the last time her leg was cleaned and wrapped was on Thursday and her next appointment is on Monday. Pt states she has been taking an antibiotic x 3 days and her symptoms have not improved. Pt states she believes she also now has a yeast infection. Pt rates the pain in her leg as a burning pain and rates it 7/10. Pt is not tachycardic nor is she febrile. Pt also states she is out of her pain medication

## 2017-01-22 NOTE — ED Provider Notes (Signed)
MSE was initiated and I personally evaluated the patient and placed orders (if any) at  2:41 PM on January 22, 2017.  Nursing staff asked if I could please see if this patient to screen them while in triage; briefly, pt here with long standing slowly healing wound to posterior R calf, has been going to the wound care center; last dressing change on Thursday 3 days ago. Was placed on abx at that time. Continues to have moderate amount of purulent drainage and worsening pain. Hasn't removed the dressing, but states the drainage comes up out of the top of the dressing. Doesn't feel as though her symptoms have improved since starting abx.   Limited exam due to dressing not fully being removed, however peeled down the top of the dressing and noted that the gauze is saturated with bloody purulent drainage, small superior portion of wound bed that can be visualized appears erythematous and has mucoid drainage within the wound. Full wound exam not performed due to not removing the bandage entirely. Given the failure of outpatient management, she likely needs more thorough work up and potentially admission for IV abx. Work up started and pt remains as an acuity 3 awaiting a bed in the back.   The patient appears stable so that the remainder of the MSE may be completed by another provider.   [address] Devota Viruet, [place], [place] 01/22/17 1446    Mesner, Corene Cornea, MD 01/23/17 1036
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
## 2017-01-22 NOTE — ED Notes (Signed)
ED Provider at bedside. 

## 2017-01-22 NOTE — ED Notes (Signed)
1ST culture sent down to lab.

## 2017-01-31 ENCOUNTER — Encounter (HOSPITAL_BASED_OUTPATIENT_CLINIC_OR_DEPARTMENT_OTHER): Payer: Medicaid Other | Attending: Surgery

## 2017-01-31 DIAGNOSIS — S81811A Laceration without foreign body, right lower leg, initial encounter: Secondary | ICD-10-CM | POA: Insufficient documentation

## 2017-01-31 DIAGNOSIS — L97213 Non-pressure chronic ulcer of right calf with necrosis of muscle: Secondary | ICD-10-CM | POA: Diagnosis not present

## 2017-02-03 DIAGNOSIS — S81811A Laceration without foreign body, right lower leg, initial encounter: Secondary | ICD-10-CM | POA: Diagnosis not present

## 2017-02-06 ENCOUNTER — Emergency Department (HOSPITAL_COMMUNITY)
Admission: EM | Admit: 2017-02-06 | Discharge: 2017-02-07 | Disposition: A | Discharge status: Home / Self Care | Payer: Medicaid Other | Attending: Emergency Medicine | Admitting: Emergency Medicine

## 2017-02-06 ENCOUNTER — Encounter (HOSPITAL_COMMUNITY): Payer: Self-pay | Admitting: Emergency Medicine

## 2017-02-06 DIAGNOSIS — F17200 Nicotine dependence, unspecified, uncomplicated: Secondary | ICD-10-CM | POA: Diagnosis not present

## 2017-02-06 DIAGNOSIS — Z79899 Other long term (current) drug therapy: Secondary | ICD-10-CM | POA: Insufficient documentation

## 2017-02-06 DIAGNOSIS — B9789 Other viral agents as the cause of diseases classified elsewhere: Secondary | ICD-10-CM | POA: Insufficient documentation

## 2017-02-06 DIAGNOSIS — R1030 Lower abdominal pain, unspecified: Secondary | ICD-10-CM | POA: Diagnosis present

## 2017-02-06 DIAGNOSIS — B9689 Other specified bacterial agents as the cause of diseases classified elsewhere: Secondary | ICD-10-CM

## 2017-02-06 DIAGNOSIS — N760 Acute vaginitis: Secondary | ICD-10-CM | POA: Insufficient documentation

## 2017-02-06 LAB — COMPREHENSIVE METABOLIC PANEL
ALT: 28 U/L (ref 14–54)
AST: 23 U/L (ref 15–41)
Albumin: 4.3 g/dL (ref 3.5–5.0)
Alkaline Phosphatase: 43 U/L (ref 38–126)
Anion gap: 10 (ref 5–15)
BUN: 11 mg/dL (ref 6–20)
CHLORIDE: 105 mmol/L (ref 101–111)
CO2: 23 mmol/L (ref 22–32)
Calcium: 9.6 mg/dL (ref 8.9–10.3)
Creatinine, Ser: 0.76 mg/dL (ref 0.44–1.00)
GFR calc Af Amer: >60 mL/min (ref >60)
Glucose, Bld: 126 mg/dL — ABNORMAL HIGH (ref 65–99)
Potassium: 3.5 mmol/L (ref 3.5–5.1)
Sodium: 138 mmol/L (ref 135–145)
TOTAL PROTEIN: 8.5 g/dL — AB (ref 6.5–8.1)
Total Bilirubin: 0.2 mg/dL — ABNORMAL LOW (ref 0.3–1.2)

## 2017-02-06 LAB — URINALYSIS, ROUTINE W REFLEX MICROSCOPIC
BACTERIA UA: NONE SEEN
BILIRUBIN URINE: NEGATIVE
Glucose, UA: NEGATIVE mg/dL
HGB URINE DIPSTICK: NEGATIVE
KETONES UR: NEGATIVE mg/dL
LEUKOCYTES UA: NEGATIVE
NITRITE: NEGATIVE
Protein, ur: NEGATIVE mg/dL
SPECIFIC GRAVITY, URINE: 1.021 (ref 1.005–1.030)
pH: 5 (ref 5.0–8.0)

## 2017-02-06 LAB — CBC
HEMATOCRIT: 41.9 % (ref 36.0–46.0)
HEMOGLOBIN: 14.5 g/dL (ref 12.0–15.0)
MCH: 30.7 pg (ref 26.0–34.0)
MCHC: 34.6 g/dL (ref 30.0–36.0)
MCV: 88.8 fL (ref 78.0–100.0)
Platelets: 362 10*3/uL (ref 150–400)
RBC: 4.72 MIL/uL (ref 3.87–5.11)
RDW: 12.7 % (ref 11.5–15.5)
WBC: 5.6 10*3/uL (ref 4.0–10.5)

## 2017-02-06 LAB — WET PREP, GENITAL
SPERM: NONE SEEN
TRICH WET PREP: NONE SEEN
YEAST WET PREP: NONE SEEN

## 2017-02-06 LAB — LIPASE, BLOOD: LIPASE: 21 U/L (ref 11–51)

## 2017-02-06 MED ORDER — OXYCODONE-ACETAMINOPHEN 5-325 MG PO TABS
2.0000 | ORAL_TABLET | Freq: Once | ORAL | Status: AC
  Administered 2017-02-06: 2 via ORAL
  Filled 2017-02-06: qty 2
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
## 2017-02-06 NOTE — ED Notes (Signed)
Medicated for pain after pelvic exam per Claiborne Billings EDPA
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
## 2017-02-06 NOTE — ED Triage Notes (Signed)
Patient has been on antibiotics due to wound on right leg. Patient thinks that is making her a uti. Patient is complaining of lower abdominal pain and painful urination. Patient states she is also having yeast.

## 2017-02-07 DIAGNOSIS — S81811A Laceration without foreign body, right lower leg, initial encounter: Secondary | ICD-10-CM | POA: Diagnosis not present

## 2017-02-07 MED ORDER — METRONIDAZOLE 0.75 % VA GEL: 1.0000 | Freq: Two times a day (BID) | VAGINAL | 0 refills | Status: DC

## 2017-02-08 LAB — URINE CULTURE

## 2017-02-08 LAB — GC/CHLAMYDIA PROBE AMP (~~LOC~~) NOT AT ARMC
Chlamydia: NEGATIVE
Neisseria Gonorrhea: NEGATIVE

## 2017-02-10 DIAGNOSIS — S81811A Laceration without foreign body, right lower leg, initial encounter: Secondary | ICD-10-CM | POA: Diagnosis not present

## 2017-02-14 DIAGNOSIS — S81811A Laceration without foreign body, right lower leg, initial encounter: Secondary | ICD-10-CM | POA: Diagnosis not present

## 2017-02-17 DIAGNOSIS — S81811A Laceration without foreign body, right lower leg, initial encounter: Secondary | ICD-10-CM | POA: Diagnosis not present

## 2017-02-21 DIAGNOSIS — S81811A Laceration without foreign body, right lower leg, initial encounter: Secondary | ICD-10-CM | POA: Diagnosis not present

## 2017-02-27 ENCOUNTER — Ambulatory Visit: Payer: Medicaid - Out of State | Admitting: Family Medicine

## 2017-06-06 ENCOUNTER — Encounter (HOSPITAL_COMMUNITY): Payer: Self-pay | Admitting: Emergency Medicine

## 2017-06-06 ENCOUNTER — Ambulatory Visit (HOSPITAL_COMMUNITY)
Admission: EM | Admit: 2017-06-06 | Discharge: 2017-06-06 | Disposition: A | Discharge status: Home / Self Care | Payer: Medicaid Other | Attending: Internal Medicine | Admitting: Internal Medicine

## 2017-06-06 DIAGNOSIS — N898 Other specified noninflammatory disorders of vagina: Secondary | ICD-10-CM | POA: Diagnosis present

## 2017-06-06 DIAGNOSIS — F17210 Nicotine dependence, cigarettes, uncomplicated: Secondary | ICD-10-CM | POA: Diagnosis not present

## 2017-06-06 MED ORDER — METRONIDAZOLE 500 MG PO TABS: 500.0000 mg | ORAL_TABLET | Freq: Two times a day (BID) | ORAL | 0 refills | Status: AC

## 2017-06-06 MED ORDER — FLUCONAZOLE 150 MG PO TABS: 150.0000 mg | ORAL_TABLET | Freq: Once | ORAL | 0 refills | Status: AC

## 2017-06-06 NOTE — Discharge Instructions (Signed)
We have treated you today for yeast and bacterial vaginosis, with diflucan and flagyl.   We are testing you for Gonorrhea, Chlamydia, Trichomonas, Yeast and Bacterial Vaginosis. We will call you if anything is positive and let you know if you require any further treatment. Please inform partners of any positive results.   Please return if symptoms not improving with treatment, development of fever, nausea, vomiting, abdominal pain.

## 2017-06-06 NOTE — ED Triage Notes (Signed)
PT reports vaginal discharge for 2 weeks. PT reports right leg pain. PT has a scar on back of leg that swells. PT also has fluid on left leg.   PT also reports sore throat and dry cough for a few weeks.

## 2017-06-06 NOTE — ED Provider Notes (Signed)
[place]    CSN: [id number] Arrival date & time: 06/06/17  1731     History   Chief Complaint Chief Complaint  Patient presents with  . Vaginal Discharge  . Leg Pain  . URI    HPI Doris Gonzalez is a 32 y.o. female presenting with Vaginal discharge for 2 weeks, states it is white, thick and smelly. Occasional itching. Denies dysuria, denies increased frequency. History of yeast and BV, recently on antibiotics. Denies abdominal pain, denies N/V, fever. LMP middle of December.    HPI  History reviewed. No pertinent past medical history.  There are no active problems to display for this patient.   Past Surgical History:  Procedure Laterality Date  . DILATION AND CURETTAGE OF UTERUS      OB History    No data available       Home Medications    Prior to Admission medications   Medication Sig Start Date End Date Taking? Authorizing Provider  diclofenac (VOLTAREN) 75 MG EC tablet Take 1 tablet (75 mg total) by mouth 2 (two) times daily as needed for moderate pain. 12/13/16   Katy Apo, NP  ibuprofen (ADVIL,MOTRIN) 200 MG tablet Take 600-800 mg by mouth every 8 (eight) hours as needed for headache, mild pain or moderate pain.    [provider]  ibuprofen (ADVIL,MOTRIN) 600 MG tablet Take 1 tablet (600 mg total) by mouth every 6 (six) hours as needed. Patient not taking: Reported on 01/22/2017 12/23/16   Fatima Blank, MD  metroNIDAZOLE (METROGEL VAGINAL) 0.75 % vaginal gel Place 1 Applicatorful vaginally 2 (two) times daily. Use as prescribed for 1 week 02/07/17   Antonietta Breach, PA-C  traMADol (ULTRAM) 50 MG tablet Take 1 tablet (50 mg total) by mouth every 6 (six) hours as needed for severe pain. Patient not taking: Reported on 12/23/2016 12/13/16   Katy Apo, NP    Family History No family history on file.  Social History Social History   Tobacco Use  . Smoking status: Current Every Day Smoker  . Smokeless tobacco: Never  Used  Substance Use Topics  . Alcohol use: Yes  . Drug use: No     Allergies   Shellfish allergy   Review of Systems Review of Systems  Constitutional: Negative for fever.  Respiratory: Negative for shortness of breath.   Cardiovascular: Negative for chest pain.  Gastrointestinal: Negative for abdominal pain, diarrhea, nausea and vomiting.  Genitourinary: Positive for vaginal discharge. Negative for dysuria, flank pain, genital sores, hematuria, menstrual problem, vaginal bleeding and vaginal pain.  Musculoskeletal: Negative for back pain.  Skin: Negative for rash.  Neurological: Negative for dizziness, light-headedness and headaches.     Physical Exam Triage Vital Signs ED Triage Vitals  Enc Vitals Group     BP 06/06/17 1814 (!) 138/94     Pulse Rate 06/06/17 1814 95     Resp 06/06/17 1814 16     Temp 06/06/17 1814 98.8 F (37.1 C)     Temp Source 06/06/17 1814 Oral     SpO2 06/06/17 1814 100 %     Weight 06/06/17 1816 154 lb 3.2 oz (69.9 kg)     Height --      Head Circumference --      Peak Flow --      Pain Score 06/06/17 1816 5     Pain Loc --      Pain Edu? --      Excl. in [place]? --  No data found.  Updated Vital Signs BP (!) 138/94   Pulse 95   Temp 98.8 F (37.1 C) (Oral)   Resp 16   Wt 154 lb 3.2 oz (69.9 kg)   LMP 05/08/2017   SpO2 100%   BMI 30.12 kg/m    Physical Exam  Constitutional: She appears well-developed and well-nourished. No distress.  HENT:  Head: Normocephalic and atraumatic.  Eyes: Conjunctivae are normal.  Neck: Neck supple.  Cardiovascular: Normal rate and regular rhythm.  No murmur heard. Pulmonary/Chest: Effort normal and breath sounds normal. No respiratory distress.  Abdominal: Soft. She exhibits no distension. There is no tenderness.  Genitourinary:  Genitourinary Comments: deferred  Musculoskeletal: She exhibits no edema.  Neurological: She is alert.  Skin: Skin is warm and dry.  Psychiatric: She has a normal  mood and affect.  Nursing note and vitals reviewed.    UC Treatments / Results  Labs (all labs ordered are listed, but only abnormal results are displayed) Labs Reviewed - No data to display  EKG  EKG Interpretation None       Radiology No results found.  Procedures Procedures (including critical care time)  Medications Ordered in UC Medications - No data to display   Initial Impression / Assessment and Plan / UC Course  I have reviewed the triage vital signs and the nursing notes.  Pertinent labs & imaging results that were available during my care of the patient were reviewed by me and considered in my medical decision making (see chart for details).     Patient with vaginal discharge, will treat empirically for yeast and BV. Screening for STD's performed, will call to inform of positive results.   Patient lacking fever, nausea, vomiting, abdominal pain, CMT; unlikely PID. Discussed strict return precautions. Patient verbalized understanding and is agreeable with plan.  Discussed getting set up with PCP as patient has multiple concerns.    Final Clinical Impressions(s) / UC Diagnoses   Final diagnoses:  None    ED Discharge Orders    None       Controlled Substance Prescriptions [place] Controlled Substance Registry consulted? Not Applicable   Janith Lima, PA-C 06/06/17 2053    Janith Lima, [place] 06/06/17 2057

## 2017-06-07 ENCOUNTER — Telehealth (HOSPITAL_COMMUNITY): Payer: Self-pay | Admitting: Emergency Medicine

## 2017-06-07 LAB — CERVICOVAGINAL ANCILLARY ONLY
Bacterial vaginitis: POSITIVE — AB
CANDIDA VAGINITIS: NEGATIVE
Chlamydia: NEGATIVE
Neisseria Gonorrhea: NEGATIVE
TRICH (WINDOWPATH): NEGATIVE

## 2017-06-07 NOTE — Telephone Encounter (Signed)
Patient positive for bacterial vaginosis treated with flagyl at clinic. Will call to inform.

## 2017-08-14 ENCOUNTER — Ambulatory Visit (INDEPENDENT_AMBULATORY_CARE_PROVIDER_SITE_OTHER): Payer: Medicaid Other | Admitting: Physician Assistant

## 2017-08-16 ENCOUNTER — Ambulatory Visit (INDEPENDENT_AMBULATORY_CARE_PROVIDER_SITE_OTHER): Payer: Medicaid Other | Admitting: Orthopaedic Surgery

## 2017-09-27 ENCOUNTER — Encounter (HOSPITAL_COMMUNITY): Payer: Self-pay | Admitting: Emergency Medicine

## 2017-09-27 ENCOUNTER — Emergency Department (HOSPITAL_COMMUNITY)
Admission: EM | Admit: 2017-09-27 | Discharge: 2017-09-27 | Disposition: A | Discharge status: Home / Self Care | Payer: Medicaid Other | Attending: Emergency Medicine | Admitting: Emergency Medicine

## 2017-09-27 ENCOUNTER — Other Ambulatory Visit: Payer: Self-pay

## 2017-09-27 DIAGNOSIS — F17200 Nicotine dependence, unspecified, uncomplicated: Secondary | ICD-10-CM | POA: Diagnosis not present

## 2017-09-27 DIAGNOSIS — T40601A Poisoning by unspecified narcotics, accidental (unintentional), initial encounter: Secondary | ICD-10-CM | POA: Diagnosis not present

## 2017-09-27 LAB — I-STAT BETA HCG BLOOD, ED (MC, WL, AP ONLY): I-stat hCG, quantitative: <5 m[IU]/mL (ref <5)

## 2017-09-27 LAB — COMPREHENSIVE METABOLIC PANEL
ALT: 72 U/L — ABNORMAL HIGH (ref 14–54)
AST: 98 U/L — ABNORMAL HIGH (ref 15–41)
Albumin: 4 g/dL (ref 3.5–5.0)
Alkaline Phosphatase: 59 U/L (ref 38–126)
Anion gap: 13 (ref 5–15)
BUN: 6 mg/dL (ref 6–20)
CALCIUM: 8.7 mg/dL — AB (ref 8.9–10.3)
CHLORIDE: 102 mmol/L (ref 101–111)
CO2: 24 mmol/L (ref 22–32)
Creatinine, Ser: 1.01 mg/dL — ABNORMAL HIGH (ref 0.44–1.00)
Glucose, Bld: 247 mg/dL — ABNORMAL HIGH (ref 65–99)
Potassium: 3.5 mmol/L (ref 3.5–5.1)
Sodium: 139 mmol/L (ref 135–145)
Total Bilirubin: 0.4 mg/dL (ref 0.3–1.2)
Total Protein: 8.4 g/dL — ABNORMAL HIGH (ref 6.5–8.1)

## 2017-09-27 LAB — CBC
HCT: 45 % (ref 36.0–46.0)
Hemoglobin: 15.1 g/dL — ABNORMAL HIGH (ref 12.0–15.0)
MCH: 31.3 pg (ref 26.0–34.0)
MCHC: 33.6 g/dL (ref 30.0–36.0)
MCV: 93.2 fL (ref 78.0–100.0)
PLATELETS: 204 10*3/uL (ref 150–400)
RBC: 4.83 MIL/uL (ref 3.87–5.11)
RDW: 13.1 % (ref 11.5–15.5)
WBC: 8.2 10*3/uL (ref 4.0–10.5)

## 2017-09-27 LAB — ACETAMINOPHEN LEVEL: Acetaminophen (Tylenol), Serum: <10 ug/mL — ABNORMAL LOW (ref 10–30)

## 2017-09-27 LAB — SALICYLATE LEVEL

## 2017-09-27 LAB — ETHANOL: ALCOHOL ETHYL (B): 228 mg/dL — AB (ref <10)

## 2017-09-27 LAB — CBG MONITORING, ED: GLUCOSE-CAPILLARY: 250 mg/dL — AB (ref 65–99)

## 2017-09-27 MED ORDER — SODIUM CHLORIDE 0.9 % IV BOLUS
500.0000 mL | Freq: Once | INTRAVENOUS | Status: AC
  Administered 2017-09-27: 500 mL via INTRAVENOUS

## 2017-09-27 MED ORDER — SODIUM CHLORIDE 0.9 % IV BOLUS
1000.0000 mL | Freq: Once | INTRAVENOUS | Status: AC
  Administered 2017-09-27: 1000 mL via INTRAVENOUS

## 2017-09-27 NOTE — ED Notes (Signed)
Pt is asleep  Resp even and nonlabored

## 2017-09-27 NOTE — ED Triage Notes (Signed)
PT brought in by EMS   They were called out by pt's friend stating she was unconscious with anginal respirations  Boyfriend reports they had drank some Ritas and done some shots  Initial respirations were 8  EMS bagged pt started an IV and gave Narcan 0.5mg  IV  Pt woke up  Pt had some vomiting on scene

## 2017-09-27 NOTE — Discharge Instructions (Addendum)
You cannot drink alcohol and take your pain medications or any anxiety or itching medications. You almost stopped breathing tonight from taking your medication with alcohol. YOU ALMOST DIED BUT YOUR BOYFRIEND CALLED EMS AND GOT YOU TREATED.
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
## 2017-09-27 NOTE — ED Notes (Signed)
ED Provider at bedside. 

## 2017-09-27 NOTE — ED Notes (Signed)
Pt is alert but groggy

## 2017-10-16 DIAGNOSIS — F17200 Nicotine dependence, unspecified, uncomplicated: Secondary | ICD-10-CM | POA: Insufficient documentation

## 2017-10-16 DIAGNOSIS — H6692 Otitis media, unspecified, left ear: Secondary | ICD-10-CM | POA: Diagnosis not present

## 2017-10-16 DIAGNOSIS — I809 Phlebitis and thrombophlebitis of unspecified site: Secondary | ICD-10-CM | POA: Insufficient documentation

## 2017-10-16 DIAGNOSIS — D229 Melanocytic nevi, unspecified: Secondary | ICD-10-CM | POA: Insufficient documentation

## 2017-10-16 DIAGNOSIS — H9202 Otalgia, left ear: Secondary | ICD-10-CM | POA: Diagnosis present

## 2017-10-17 ENCOUNTER — Encounter (HOSPITAL_COMMUNITY): Payer: Self-pay | Admitting: Emergency Medicine

## 2017-10-17 ENCOUNTER — Emergency Department (HOSPITAL_COMMUNITY)
Admission: EM | Admit: 2017-10-17 | Discharge: 2017-10-17 | Disposition: A | Discharge status: Home / Self Care | Payer: Medicaid Other | Attending: Emergency Medicine | Admitting: Emergency Medicine

## 2017-10-17 ENCOUNTER — Other Ambulatory Visit: Payer: Self-pay

## 2017-10-17 DIAGNOSIS — H6690 Otitis media, unspecified, unspecified ear: Secondary | ICD-10-CM

## 2017-10-17 DIAGNOSIS — R233 Spontaneous ecchymoses: Secondary | ICD-10-CM

## 2017-10-17 DIAGNOSIS — D229 Melanocytic nevi, unspecified: Secondary | ICD-10-CM

## 2017-10-17 DIAGNOSIS — I809 Phlebitis and thrombophlebitis of unspecified site: Secondary | ICD-10-CM

## 2017-10-17 HISTORY — DX: Anxiety disorder, unspecified: F41.9

## 2017-10-17 MED ORDER — AMOXICILLIN 500 MG PO CAPS: 500.0000 mg | ORAL_CAPSULE | Freq: Three times a day (TID) | ORAL | 0 refills | Status: DC

## 2017-10-17 NOTE — ED Triage Notes (Signed)
Pt is c/o left ear pain x 2 days  Pt states her right arm hurts from where she had an IV a couple of weeks ago  Pt also c/o a mole on her left lower leg she states keeps bleeding everytime she hits it on anything

## 2017-10-17 NOTE — ED Provider Notes (Signed)
[facility] DEPT Provider Note   CSN: [id number] Arrival date & time: 10/16/17  2232     History   Chief Complaint Chief Complaint  Patient presents with  . Otalgia  . Arm Pain    HPI Doris Gonzalez is a 32 y.o. female.  Patient presents to the emergency department with multiple complaints.  She states that she has had left ear pain x2 days.  She reports ear fullness and pain with jaw movement.  She also complains of right arm pain from where she had an IV placed.  She states that the area was swollen, but this is gone down.  She denies any fevers or chills.  She also complains of a mole on her leg which has been bleeding because it keeps on getting bumped on things.  She denies any other associated symptoms.  She has not taken anything for her symptoms.  The history is provided by the patient. No language interpreter was used.    Past Medical History:  Diagnosis Date  . Anxiety     There are no active problems to display for this patient.   Past Surgical History:  Procedure Laterality Date  . DILATION AND CURETTAGE OF UTERUS       OB History   None      Home Medications    Prior to Admission medications   Medication Sig Start Date End Date Taking? Authorizing Provider  amoxicillin (AMOXIL) 500 MG capsule Take 1 capsule (500 mg total) by mouth 3 (three) times daily. 10/17/17   Montine Circle, PA-C  diclofenac (VOLTAREN) 75 MG EC tablet Take 1 tablet (75 mg total) by mouth 2 (two) times daily as needed for moderate pain. Patient not taking: Reported on 09/27/2017 12/13/16   Katy Apo, NP  ibuprofen (ADVIL,MOTRIN) 200 MG tablet Take 600-800 mg by mouth every 8 (eight) hours as needed for headache, mild pain or moderate pain.    [provider]  ibuprofen (ADVIL,MOTRIN) 600 MG tablet Take 1 tablet (600 mg total) by mouth every 6 (six) hours as needed. Patient not taking: Reported on 01/22/2017 12/23/16   Fatima Blank, MD  metroNIDAZOLE (METROGEL VAGINAL) 0.75 % vaginal gel Place 1 Applicatorful vaginally 2 (two) times daily. Use as prescribed for 1 week Patient not taking: Reported on 09/27/2017 02/07/17   Antonietta Breach, PA-C  traMADol (ULTRAM) 50 MG tablet Take 1 tablet (50 mg total) by mouth every 6 (six) hours as needed for severe pain. Patient not taking: Reported on 12/23/2016 12/13/16   Katy Apo, NP    Family History Family History  Problem Relation Age of Onset  . Hypertension Other   . Diabetes Other     Social History Social History   Tobacco Use  . Smoking status: Current Every Day Smoker  . Smokeless tobacco: Never Used  Substance Use Topics  . Alcohol use: Yes    Comment: occ  . Drug use: No     Allergies   Shellfish allergy   Review of Systems Review of Systems  All other systems reviewed and are negative.    Physical Exam Updated Vital Signs BP (!) 134/104 (BP Location: Right Arm)   Pulse (!) 101   Temp 97.7 F (36.5 C) (Oral)   Resp 20   LMP 10/14/2017 (Exact Date)   SpO2 100%   Physical Exam  Constitutional: She is oriented to person, place, and time. She appears well-developed and well-nourished.  HENT:  Head: Normocephalic  and atraumatic.  Left-sided otitis media  Eyes: Pupils are equal, round, and reactive to light. Conjunctivae and EOM are normal.  Neck: Normal range of motion. Neck supple.  Cardiovascular: Normal rate and regular rhythm. Exam reveals no gallop and no friction rub.  No murmur heard. Pulmonary/Chest: Effort normal and breath sounds normal. No respiratory distress. She has no wheezes. She has no rales. She exhibits no tenderness.  Abdominal: Soft. Bowel sounds are normal. She exhibits no distension and no mass. There is no tenderness. There is no rebound and no guarding.  Musculoskeletal: Normal range of motion. She exhibits no edema or tenderness.  Neurological: She is alert and oriented to person, place, and time.  Skin:  Skin is warm and dry.  Possible mild phlebitis of the right anterior forearm, no evidence of cellulitis or abscess  1 x 1 cm mole on left lower leg, not currently bleeding  Psychiatric: She has a normal mood and affect. Her behavior is normal. Judgment and thought content normal.  Nursing note and vitals reviewed.    ED Treatments / Results  Labs (all labs ordered are listed, but only abnormal results are displayed) Labs Reviewed - No data to display  EKG None  Radiology No results found.  Procedures Procedures (including critical care time)  Medications Ordered in ED Medications - No data to display   Initial Impression / Assessment and Plan / ED Course  I have reviewed the triage vital signs and the nursing notes.  Pertinent labs & imaging results that were available during my care of the patient were reviewed by me and considered in my medical decision making (see chart for details).     Patient with otitis media.  Will treat with amoxicillin.  Recommend warm compresses for possible phlebitis, no evidence of cellulitis or abscess.  Recommend PCP follow-up for mole.  Final Clinical Impressions(s) / ED Diagnoses   Final diagnoses:  Acute otitis media, unspecified otitis media type  Phlebitis  Bleeding skin mole    ED Discharge Orders        Ordered    amoxicillin (AMOXIL) 500 MG capsule  3 times daily     10/17/17 0404       Montine Circle, PA-C 10/17/17 7846    Rolland Porter, MD 10/17/17 0710

## 2018-09-08 ENCOUNTER — Other Ambulatory Visit: Payer: Self-pay

## 2018-09-08 ENCOUNTER — Emergency Department (HOSPITAL_COMMUNITY)
Admission: EM | Admit: 2018-09-08 | Discharge: 2018-09-09 | Disposition: A | Discharge status: Home / Self Care | Payer: Medicaid Other | Attending: Emergency Medicine | Admitting: Emergency Medicine

## 2018-09-08 ENCOUNTER — Emergency Department (HOSPITAL_COMMUNITY)
Admission: EM | Admit: 2018-09-08 | Discharge: 2018-09-08 | Disposition: A | Discharge status: Home / Self Care | Payer: Medicaid Other | Source: Home / Self Care | Attending: Emergency Medicine | Admitting: Emergency Medicine

## 2018-09-08 ENCOUNTER — Emergency Department (HOSPITAL_COMMUNITY): Payer: Medicaid Other

## 2018-09-08 ENCOUNTER — Ambulatory Visit (HOSPITAL_COMMUNITY): Admission: RE | Admit: 2018-09-08 | Payer: Medicaid Other | Source: Ambulatory Visit

## 2018-09-08 DIAGNOSIS — F10929 Alcohol use, unspecified with intoxication, unspecified: Secondary | ICD-10-CM

## 2018-09-08 DIAGNOSIS — F1410 Cocaine abuse, uncomplicated: Secondary | ICD-10-CM

## 2018-09-08 DIAGNOSIS — Z590 Homelessness: Secondary | ICD-10-CM | POA: Insufficient documentation

## 2018-09-08 DIAGNOSIS — R4182 Altered mental status, unspecified: Secondary | ICD-10-CM

## 2018-09-08 DIAGNOSIS — F1920 Other psychoactive substance dependence, uncomplicated: Secondary | ICD-10-CM | POA: Diagnosis present

## 2018-09-08 LAB — CBC WITH DIFFERENTIAL/PLATELET
Abs Immature Granulocytes: 0.02 10*3/uL (ref 0.00–0.07)
Basophils Absolute: 0 10*3/uL (ref 0.0–0.1)
Basophils Relative: 1 %
Eosinophils Absolute: 0.1 10*3/uL (ref 0.0–0.5)
Eosinophils Relative: 1 %
HCT: 43.1 % (ref 36.0–46.0)
Hemoglobin: 14 g/dL (ref 12.0–15.0)
Immature Granulocytes: 0 %
Lymphocytes Relative: 49 %
Lymphs Abs: 2.8 10*3/uL (ref 0.7–4.0)
MCH: 30.6 pg (ref 26.0–34.0)
MCHC: 32.5 g/dL (ref 30.0–36.0)
MCV: 94.3 fL (ref 80.0–100.0)
Monocytes Absolute: 0.3 10*3/uL (ref 0.1–1.0)
Monocytes Relative: 6 %
Neutro Abs: 2.4 10*3/uL (ref 1.7–7.7)
Neutrophils Relative %: 43 %
Platelets: 263 10*3/uL (ref 150–400)
RBC: 4.57 MIL/uL (ref 3.87–5.11)
RDW: 11.9 % (ref 11.5–15.5)
WBC: 5.7 10*3/uL (ref 4.0–10.5)
nRBC: 0 % (ref 0.0–0.2)

## 2018-09-08 LAB — RAPID URINE DRUG SCREEN, HOSP PERFORMED
Amphetamines: NOT DETECTED
Barbiturates: NOT DETECTED
Benzodiazepines: NOT DETECTED
Cocaine: POSITIVE — AB
Opiates: NOT DETECTED
Tetrahydrocannabinol: NOT DETECTED

## 2018-09-08 LAB — COMPREHENSIVE METABOLIC PANEL
ALT: 21 U/L (ref 0–44)
AST: 23 U/L (ref 15–41)
Albumin: 4.2 g/dL (ref 3.5–5.0)
Alkaline Phosphatase: 36 U/L — ABNORMAL LOW (ref 38–126)
Anion gap: 11 (ref 5–15)
BUN: 10 mg/dL (ref 8–23)
CO2: 22 mmol/L (ref 22–32)
Calcium: 8.8 mg/dL — ABNORMAL LOW (ref 8.9–10.3)
Chloride: 102 mmol/L (ref 98–111)
Creatinine, Ser: 0.8 mg/dL (ref 0.44–1.00)
GFR calc Af Amer: 52 mL/min — ABNORMAL LOW (ref >60)
GFR calc non Af Amer: 45 mL/min — ABNORMAL LOW (ref >60)
Glucose, Bld: 139 mg/dL — ABNORMAL HIGH (ref 70–99)
Potassium: 3 mmol/L — ABNORMAL LOW (ref 3.5–5.1)
Sodium: 135 mmol/L (ref 135–145)
Total Bilirubin: 1 mg/dL (ref 0.3–1.2)
Total Protein: 8.1 g/dL (ref 6.5–8.1)

## 2018-09-08 LAB — URINALYSIS, COMPLETE (UACMP) WITH MICROSCOPIC
Bilirubin Urine: NEGATIVE
Glucose, UA: NEGATIVE mg/dL
Ketones, ur: NEGATIVE mg/dL
Leukocytes,Ua: NEGATIVE
Nitrite: NEGATIVE
Protein, ur: NEGATIVE mg/dL
Specific Gravity, Urine: 1.002 — ABNORMAL LOW (ref 1.005–1.030)
pH: 6 (ref 5.0–8.0)

## 2018-09-08 LAB — ETHANOL: Alcohol, Ethyl (B): 444 mg/dL (ref <10)

## 2018-09-08 LAB — CBG MONITORING, ED: Glucose-Capillary: 141 mg/dL — ABNORMAL HIGH (ref 70–99)

## 2018-09-08 LAB — AMMONIA: Ammonia: 21 umol/L (ref 9–35)

## 2018-09-08 LAB — LACTIC ACID, PLASMA
Lactic Acid, Venous: 2 mmol/L (ref 0.5–1.9)
Lactic Acid, Venous: 0.9 mmol/L (ref 0.5–1.9)

## 2018-09-08 MED ORDER — ONDANSETRON HCL 4 MG/2ML IJ SOLN
4.0000 mg | Freq: Once | INTRAMUSCULAR | Status: AC
  Administered 2018-09-08: 19:00:00 4 mg via INTRAVENOUS

## 2018-09-08 MED ORDER — NALOXONE HCL 2 MG/2ML IJ SOSY
1.0000 mg | PREFILLED_SYRINGE | Freq: Once | INTRAMUSCULAR | Status: AC
  Administered 2018-09-08: 19:00:00 1 mg via INTRAVENOUS

## 2018-09-08 MED ORDER — ZIPRASIDONE MESYLATE 20 MG IM SOLR
10.0000 mg | Freq: Once | INTRAMUSCULAR | Status: AC
  Administered 2018-09-08: 10 mg via INTRAMUSCULAR
  Filled 2018-09-08: qty 20

## 2018-09-08 MED ORDER — SODIUM CHLORIDE 0.9 % IV SOLN
INTRAVENOUS | Status: DC
  Administered 2018-09-08: 21:00:00 via INTRAVENOUS

## 2018-09-08 MED ORDER — NALOXONE HCL 2 MG/2ML IJ SOSY
PREFILLED_SYRINGE | INTRAMUSCULAR | Status: AC
  Administered 2018-09-08: 19:00:00 2 mg
  Filled 2018-09-08: qty 2

## 2018-09-08 NOTE — ED Notes (Signed)
See initial chart for [name]
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
## 2018-09-08 NOTE — ED Notes (Signed)
Bed: RESB Expected date:  Expected time:  Means of arrival:  Comments: Room 4
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
## 2018-09-08 NOTE — ED Notes (Signed)
Date and time results received: 09/08/18 1952  Test: Lactic Acid 2.0/ETOH 444 Critical Value:see above  Name of Provider Notified: Sophia C. EDPA  Orders Received? Or Actions Taken?:
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
## 2018-09-08 NOTE — Discharge Instructions (Signed)
It is important that you stop drinking alcohol and using drugs. Make sure you stay well-hydrated water. There is information about resources in the community to help you quit. Return to the emergency room with any new, worsening, concerning symptoms.

## 2018-12-01 ENCOUNTER — Encounter (HOSPITAL_COMMUNITY): Payer: Self-pay

## 2018-12-01 ENCOUNTER — Other Ambulatory Visit: Payer: Self-pay

## 2018-12-01 ENCOUNTER — Emergency Department (HOSPITAL_COMMUNITY)
Admission: EM | Admit: 2018-12-01 | Discharge: 2018-12-01 | Disposition: A | Discharge status: Home / Self Care | Payer: Medicaid Other | Attending: Emergency Medicine | Admitting: Emergency Medicine

## 2018-12-01 DIAGNOSIS — F10920 Alcohol use, unspecified with intoxication, uncomplicated: Secondary | ICD-10-CM

## 2018-12-01 DIAGNOSIS — F17200 Nicotine dependence, unspecified, uncomplicated: Secondary | ICD-10-CM | POA: Diagnosis not present

## 2018-12-01 DIAGNOSIS — F10929 Alcohol use, unspecified with intoxication, unspecified: Secondary | ICD-10-CM | POA: Diagnosis present

## 2018-12-01 MED ORDER — SODIUM CHLORIDE 0.9 % IV BOLUS: 1000.0000 mL | Freq: Once | INTRAVENOUS | Status: DC
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
## 2018-12-01 NOTE — Discharge Instructions (Signed)
Please make sure you are drinking plenty of fluids, eating small frequent meals today to help with nausea and dehydration.  Please make sure you drink responsibly.

## 2018-12-01 NOTE — ED Notes (Signed)
Bed: VO36 Expected date:  Expected time:  Means of arrival:  Comments: EMS 33 yo female ETOH abuse/vomiting GCS 15

## 2018-12-01 NOTE — ED Provider Notes (Signed)
Care assumed from PA Pemberton at shift change, please see her note for full details, but in brief Doris Gonzalez is a 33 y.o. female here with acute alcohol intoxication.  Denies other associated drug use.  Initially was nauseated, has been here in the ED sleeping peacefully with stable vitals.  She had normal reactive pupils with no neurologic deficits on arrival and no evidence of trauma.  Her previous care provider, patient is clinically intoxicated, no labs or additional work-up indicated, patient being allowed to metabolize and then will be reassessed.  Physical Exam  BP (!) 130/99   Pulse 87   Temp 97.7 F (36.5 C) (Oral)   Resp 16   SpO2 99%   Physical Exam Vitals signs and nursing note reviewed.  Constitutional:      General: She is not in acute distress.    Appearance: Normal appearance. She is well-developed and normal weight. She is not diaphoretic.     Comments: Sitting up alert, eating sandwich in bed, appropriately conversant.  HENT:     Head: Normocephalic and atraumatic.  Eyes:     General:        Right eye: No discharge.        Left eye: No discharge.     Conjunctiva/sclera: Conjunctivae normal.     Pupils: Pupils are equal, round, and reactive to light.  Cardiovascular:     Rate and Rhythm: Normal rate and regular rhythm.  Pulmonary:     Effort: Pulmonary effort is normal. No respiratory distress.  Neurological:     Mental Status: She is alert.     Coordination: Coordination normal.     Comments: Speech is clear, able to follow commands Moves extremities without ataxia, coordination intact Ambulatory without assistance, steady gait  Psychiatric:        Mood and Affect: Mood normal.        Behavior: Behavior normal.     ED Course/Procedures     Procedures  MDM   Patient has been monitored in the emergency department for almost 6 hours, on arrival she was clearly intoxicated but after having a time to metabolize she is now alert, speech is clear she is  oriented, she is tolerating p.o. and has been ambulatory without assistance.  I feel patient is clinically sober at this time and is appropriate for discharge home, she is working on finding a ride home at this time.  Patient will be discharged home in good condition.  Discussed safe use of alcohol.   Final diagnoses:  Alcoholic intoxication without complication [facility])       Jacqlyn Larsen, PA-C 12/01/18 1037    Maudie Flakes, MD 12/05/18 1025

## 2018-12-01 NOTE — ED Notes (Signed)
Patient sleeping peacefully with no complaints. Will continue to monitor patient.

## 2018-12-01 NOTE — ED Notes (Signed)
Pt ambulated to the restroom without difficulty.  Pt able to tolerate PO fluids without emesis.  PA made aware.
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
## 2019-10-09 ENCOUNTER — Encounter (HOSPITAL_COMMUNITY): Payer: Self-pay

## 2019-10-09 ENCOUNTER — Emergency Department (HOSPITAL_COMMUNITY)
Admission: EM | Admit: 2019-10-09 | Discharge: 2019-10-09 | Disposition: A | Discharge status: Home / Self Care | Payer: Medicaid Other | Attending: Emergency Medicine | Admitting: Emergency Medicine

## 2019-10-09 ENCOUNTER — Other Ambulatory Visit: Payer: Self-pay

## 2019-10-09 ENCOUNTER — Emergency Department (HOSPITAL_COMMUNITY): Payer: Medicaid Other

## 2019-10-09 DIAGNOSIS — Y9389 Activity, other specified: Secondary | ICD-10-CM | POA: Insufficient documentation

## 2019-10-09 DIAGNOSIS — S0990XA Unspecified injury of head, initial encounter: Secondary | ICD-10-CM | POA: Diagnosis present

## 2019-10-09 DIAGNOSIS — B373 Candidiasis of vulva and vagina: Secondary | ICD-10-CM | POA: Insufficient documentation

## 2019-10-09 DIAGNOSIS — B9689 Other specified bacterial agents as the cause of diseases classified elsewhere: Secondary | ICD-10-CM

## 2019-10-09 DIAGNOSIS — M542 Cervicalgia: Secondary | ICD-10-CM | POA: Diagnosis not present

## 2019-10-09 DIAGNOSIS — S0083XA Contusion of other part of head, initial encounter: Secondary | ICD-10-CM | POA: Insufficient documentation

## 2019-10-09 DIAGNOSIS — S30810A Abrasion of lower back and pelvis, initial encounter: Secondary | ICD-10-CM | POA: Diagnosis not present

## 2019-10-09 DIAGNOSIS — Y999 Unspecified external cause status: Secondary | ICD-10-CM | POA: Diagnosis not present

## 2019-10-09 DIAGNOSIS — F17210 Nicotine dependence, cigarettes, uncomplicated: Secondary | ICD-10-CM | POA: Insufficient documentation

## 2019-10-09 DIAGNOSIS — N898 Other specified noninflammatory disorders of vagina: Secondary | ICD-10-CM | POA: Diagnosis not present

## 2019-10-09 DIAGNOSIS — Y92410 Unspecified street and highway as the place of occurrence of the external cause: Secondary | ICD-10-CM | POA: Insufficient documentation

## 2019-10-09 DIAGNOSIS — B3731 Acute candidiasis of vulva and vagina: Secondary | ICD-10-CM

## 2019-10-09 LAB — WET PREP, GENITAL
Sperm: NONE SEEN
Trich, Wet Prep: NONE SEEN

## 2019-10-09 LAB — POC URINE PREG, ED: Preg Test, Ur: NEGATIVE

## 2019-10-09 MED ORDER — IBUPROFEN 800 MG PO TABS: 800.0000 mg | ORAL_TABLET | Freq: Once | ORAL | Status: DC

## 2019-10-09 MED ORDER — FLUCONAZOLE 150 MG PO TABS: 150.0000 mg | ORAL_TABLET | Freq: Once | ORAL | 0 refills | Status: DC

## 2019-10-09 MED ORDER — NAPROXEN 500 MG PO TABS: 500.0000 mg | ORAL_TABLET | Freq: Two times a day (BID) | ORAL | 0 refills | Status: DC

## 2019-10-09 MED ORDER — METRONIDAZOLE 500 MG PO TABS
500.0000 mg | ORAL_TABLET | Freq: Once | ORAL | Status: AC
  Administered 2019-10-09: 500 mg via ORAL
  Filled 2019-10-09: qty 1

## 2019-10-09 MED ORDER — METRONIDAZOLE 500 MG PO TABS: 500.0000 mg | ORAL_TABLET | Freq: Two times a day (BID) | ORAL | 0 refills | Status: DC

## 2019-10-09 MED ORDER — LIDOCAINE 5 % EX PTCH
1.0000 | MEDICATED_PATCH | CUTANEOUS | Status: DC
  Administered 2019-10-09: 1 via TRANSDERMAL
  Filled 2019-10-09: qty 1

## 2019-10-09 MED ORDER — METHOCARBAMOL 500 MG PO TABS: 500.0000 mg | ORAL_TABLET | Freq: Two times a day (BID) | ORAL | 0 refills | Status: DC

## 2019-10-09 MED ORDER — FLUCONAZOLE 150 MG PO TABS: 150.0000 mg | ORAL_TABLET | Freq: Once | ORAL | 0 refills | Status: AC

## 2019-10-09 MED ORDER — FLUCONAZOLE 150 MG PO TABS
150.0000 mg | ORAL_TABLET | Freq: Once | ORAL | Status: AC
  Administered 2019-10-09: 150 mg via ORAL
  Filled 2019-10-09: qty 1

## 2019-10-09 MED ORDER — ACETAMINOPHEN 325 MG PO TABS
650.0000 mg | ORAL_TABLET | Freq: Once | ORAL | Status: AC
  Administered 2019-10-09: 650 mg via ORAL
  Filled 2019-10-09: qty 2

## 2019-10-09 NOTE — Discharge Instructions (Addendum)
As discussed, your head CT was negative for any bleeding in the brain. Continue to ice the back of your head. I am sending you home with  pain medication and a muscle relaxer. Take as prescribed. I am also sending you home with an antibiotic for bacterial vaginosis. Take twice a day for 7 days. Do not drink alcohol while on the medication. Take fluconazole once you are finished with Flagyl. Follow-up with PCP if symptoms do not improve within the next week. Return to the ER for new or worsening symptoms.
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
## 2019-10-28 ENCOUNTER — Other Ambulatory Visit: Payer: Self-pay

## 2019-10-28 ENCOUNTER — Emergency Department (HOSPITAL_COMMUNITY)
Admission: EM | Admit: 2019-10-28 | Discharge: 2019-10-29 | Disposition: A | Discharge status: Home / Self Care | Payer: Medicaid Other | Attending: Emergency Medicine | Admitting: Emergency Medicine

## 2019-10-28 DIAGNOSIS — F17210 Nicotine dependence, cigarettes, uncomplicated: Secondary | ICD-10-CM | POA: Insufficient documentation

## 2019-10-28 DIAGNOSIS — Z5900 Homelessness unspecified: Secondary | ICD-10-CM

## 2019-10-28 DIAGNOSIS — Z79899 Other long term (current) drug therapy: Secondary | ICD-10-CM | POA: Diagnosis not present

## 2019-10-28 DIAGNOSIS — F10929 Alcohol use, unspecified with intoxication, unspecified: Secondary | ICD-10-CM | POA: Insufficient documentation

## 2019-10-28 DIAGNOSIS — F10920 Alcohol use, unspecified with intoxication, uncomplicated: Secondary | ICD-10-CM

## 2019-10-29 ENCOUNTER — Encounter (HOSPITAL_COMMUNITY): Payer: Self-pay | Admitting: Emergency Medicine

## 2019-10-29 LAB — BASIC METABOLIC PANEL WITH GFR
Anion gap: 12 (ref 5–15)
BUN: 10 mg/dL (ref 6–20)
CO2: 25 mmol/L (ref 22–32)
Calcium: 9.2 mg/dL (ref 8.9–10.3)
Chloride: 107 mmol/L (ref 98–111)
Creatinine, Ser: 0.85 mg/dL (ref 0.44–1.00)
GFR calc Af Amer: >60 mL/min
GFR calc non Af Amer: >60 mL/min
Glucose, Bld: 92 mg/dL (ref 70–99)
Potassium: 4.1 mmol/L (ref 3.5–5.1)
Sodium: 144 mmol/L (ref 135–145)

## 2019-10-29 LAB — RAPID URINE DRUG SCREEN, HOSP PERFORMED
Amphetamines: NOT DETECTED
Barbiturates: NOT DETECTED
Benzodiazepines: NOT DETECTED
Cocaine: POSITIVE — AB
Opiates: NOT DETECTED
Tetrahydrocannabinol: NOT DETECTED

## 2019-10-29 LAB — CBC
HCT: 40.8 % (ref 36.0–46.0)
Hemoglobin: 13.4 g/dL (ref 12.0–15.0)
MCH: 29.7 pg (ref 26.0–34.0)
MCHC: 32.8 g/dL (ref 30.0–36.0)
MCV: 90.5 fL (ref 80.0–100.0)
Platelets: 332 10*3/uL (ref 150–400)
RBC: 4.51 MIL/uL (ref 3.87–5.11)
RDW: 12.2 % (ref 11.5–15.5)
WBC: 5.8 10*3/uL (ref 4.0–10.5)
nRBC: 0 % (ref 0.0–0.2)

## 2019-10-29 LAB — PREGNANCY, URINE: Preg Test, Ur: NEGATIVE

## 2019-10-29 LAB — ETHANOL: Alcohol, Ethyl (B): 298 mg/dL — ABNORMAL HIGH (ref <10)

## 2019-10-29 NOTE — ED Notes (Signed)
Pt states that her left eye hurts. Her eye is red and bloodshot. Pt has been rubbing her eye and states that it may hurt from her rubbing it. Pt was able to ambulate to the bathroom without assistance.
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
## 2019-10-29 NOTE — ED Provider Notes (Signed)
5:53 AM  Patient signed out pending reexam and evaluation of labs.  Labs are within normal limits.  She is cocaine and alcohol positive with BAL 298.    On re-exam, she is awake and alert.  VSS and reviewed.  No complaints.  Will d/c home.    After history, exam, and medical workup I feel the patient has been appropriately medically screened and is safe for discharge home. Pertinent diagnoses were discussed with the patient. Patient was given return precautions.    Physical Exam  BP (!) 101/52   Pulse (!) 101   Temp 97.6 F (36.4 C) (Oral)   Resp 16   Ht 1.524 m (5')   Wt 63 kg   LMP 10/09/2019   SpO2 95%   BMI 27.13 kg/m        Merryl Hacker, MD 10/29/19 [phone]
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
## 2020-01-09 ENCOUNTER — Other Ambulatory Visit: Payer: Self-pay

## 2020-01-09 ENCOUNTER — Ambulatory Visit (HOSPITAL_COMMUNITY)
Admission: EM | Admit: 2020-01-09 | Discharge: 2020-01-09 | Disposition: A | Discharge status: Home / Self Care | Payer: Medicaid Other | Attending: Family Medicine | Admitting: Family Medicine

## 2020-01-09 ENCOUNTER — Encounter (HOSPITAL_COMMUNITY): Payer: Self-pay | Admitting: Emergency Medicine

## 2020-01-09 DIAGNOSIS — N760 Acute vaginitis: Secondary | ICD-10-CM | POA: Diagnosis present

## 2020-01-09 DIAGNOSIS — L989 Disorder of the skin and subcutaneous tissue, unspecified: Secondary | ICD-10-CM | POA: Diagnosis present

## 2020-01-09 DIAGNOSIS — Z113 Encounter for screening for infections with a predominantly sexual mode of transmission: Secondary | ICD-10-CM | POA: Insufficient documentation

## 2020-01-09 LAB — POCT URINALYSIS DIPSTICK, ED / UC
Glucose, UA: NEGATIVE mg/dL
Hgb urine dipstick: NEGATIVE
Ketones, ur: NEGATIVE mg/dL
Leukocytes,Ua: NEGATIVE
Nitrite: NEGATIVE
Protein, ur: NEGATIVE mg/dL
Specific Gravity, Urine: 1.025 (ref 1.005–1.030)
Urobilinogen, UA: 1 mg/dL (ref 0.0–1.0)
pH: 6 (ref 5.0–8.0)

## 2020-01-09 LAB — POC URINE PREG, ED: Preg Test, Ur: NEGATIVE

## 2020-01-09 MED ORDER — METRONIDAZOLE 500 MG PO TABS
500.0000 mg | ORAL_TABLET | Freq: Two times a day (BID) | ORAL | 0 refills | Status: DC
Start: 2020-01-09 — End: 2020-06-17

## 2020-01-09 MED ORDER — FLUCONAZOLE 150 MG PO TABS
150.0000 mg | ORAL_TABLET | Freq: Every day | ORAL | 0 refills | Status: DC
Start: 2020-01-09 — End: 2020-06-18

## 2020-01-09 NOTE — ED Triage Notes (Signed)
Pt presents with white vaginal discharge and odor xs 2-3 days. States has hx of Yeast infection and BV. Denies vaginal itching, burning, abdominal or back pain, N,V,D.  Denies use of OTC medication.   Pt presents with skin growth on left leg, states growth has been there since teens. States was evaluated 2-3 years ago. States is painful at times when wearing pants or hits growth.

## 2020-01-09 NOTE — ED Provider Notes (Signed)
[place]    CSN: [id number] Arrival date & time: 01/09/20  1740      History   Chief Complaint Chief Complaint  Patient presents with  . Vaginitis    HPI Doris Gonzalez is a 34 y.o. female.   HPI  Patient is here for vaginitis.  She has discharge and odor.'s been going on for couple of days.  She thinks she has yeast and BV.  She has had these recurrently.  While she is here she would like to have STD check. She has a lesion on her leg.  It is a growth that sticks out from the skin.  It catches on clothing.  It hurts and bleeds.  She wants it checked  Past Medical History:  Diagnosis Date  . Anxiety     There are no problems to display for this patient.   Past Surgical History:  Procedure Laterality Date  . DILATION AND CURETTAGE OF UTERUS      OB History   No obstetric history on file.      Home Medications    Prior to Admission medications   Medication Sig Start Date End Date Taking? Authorizing Provider  naproxen (NAPROSYN) 500 MG tablet Take 1 tablet (500 mg total) by mouth 2 (two) times daily. 10/09/19  Yes Aberman, Druscilla Brownie, PA-C  traMADol (ULTRAM) 50 MG tablet Take 1 tablet (50 mg total) by mouth every 6 (six) hours as needed for severe pain. 12/13/16  Yes Amyot, Nicholes Stairs, NP  fluconazole (DIFLUCAN) 150 MG tablet Take 1 tablet (150 mg total) by mouth daily. Repeat in 1 week if needed 01/09/20   Raylene Everts, MD  metroNIDAZOLE (FLAGYL) 500 MG tablet Take 1 tablet (500 mg total) by mouth 2 (two) times daily. 01/09/20   Raylene Everts, MD    Family History Family History  Problem Relation Age of Onset  . Hypertension Other   . Diabetes Other   . Diabetes Mother   . Hypertension Mother   . Heart failure Father     Social History Social History   Tobacco Use  . Smoking status: Current Every Day Smoker    Packs/day: 1.00    Types: Cigarettes  . Smokeless tobacco: Never Used  Vaping Use  . Vaping Use: Never used  Substance  Use Topics  . Alcohol use: Yes    Comment: occ  . Drug use: No     Allergies   Shellfish allergy   Review of Systems Review of Systems See HPI  Physical Exam Triage Vital Signs ED Triage Vitals  Enc Vitals Group     BP 01/09/20 0959 (!) 156/99     Pulse Rate 01/09/20 0959 87     Resp 01/09/20 0959 18     Temp 01/09/20 0959 99.1 F (37.3 C)     Temp Source 01/09/20 0959 Oral     SpO2 01/09/20 0959 100 %     Weight --      Height --      Head Circumference --      Peak Flow --      Pain Score 01/09/20 0955 0     Pain Loc --      Pain Edu? --      Excl. in [place]? --    No data found.  Updated Vital Signs BP (!) 156/99 (BP Location: Left Arm)   Pulse 87   Temp 99.1 F (37.3 C) (Oral)   Resp 18  LMP 12/29/2019 (Approximate)   SpO2 100%     Physical Exam Constitutional:      General: She is not in acute distress.    Appearance: She is well-developed and normal weight.  HENT:     Head: Normocephalic and atraumatic.     Mouth/Throat:     Comments: Mask is in place Eyes:     Conjunctiva/sclera: Conjunctivae normal.     Pupils: Pupils are equal, round, and reactive to light.  Cardiovascular:     Rate and Rhythm: Normal rate.  Pulmonary:     Effort: Pulmonary effort is normal. No respiratory distress.  Abdominal:     General: There is no distension.     Palpations: Abdomen is soft.  Genitourinary:    Comments: Self swab obtained Musculoskeletal:        General: Normal range of motion.     Cervical back: Normal range of motion.  Skin:    General: Skin is warm and dry.     Comments: On the lateral portion of the left knee there is a 1 cm pedunculated mass that is firm with some bleeding around the edges.  Neurological:     Mental Status: She is alert.  Psychiatric:        Mood and Affect: Mood normal.        Behavior: Behavior normal.      UC Treatments / Results  Labs (all labs ordered are listed, but only abnormal results are displayed) Labs  Reviewed  POCT URINALYSIS DIPSTICK, ED / UC - Abnormal; Notable for the following components:      Result Value   Bilirubin Urine SMALL (*)    All other components within normal limits  POC URINE PREG, ED  CERVICOVAGINAL ANCILLARY ONLY    EKG   Radiology No results found.  Procedures Procedures (including critical care time)  Medications Ordered in UC Medications - No data to display  Initial Impression / Assessment and Plan / UC Course  I have reviewed the triage vital signs and the nursing notes.  Pertinent labs & imaging results that were available during my care of the patient were reviewed by me and considered in my medical decision making (see chart for details).     I advised the patient to see a primary care doctor who can send her for referral to have this removed.  I think if she goes to the [facility] they will remove it for her.  I told her that it should not be cut off at the skin but likely needs to be excised so that it does not grow back.  She states she was going to try a piece of dental floss around it to see if it would fall off and I discouraged her from self-care Final Clinical Impressions(s) / UC Diagnoses   Final diagnoses:  Acute vaginitis  Screen for STD (sexually transmitted disease)  Benign skin growth     Discharge Instructions     Take the antibiotics as directed Start taking a probiotic for women every day This may help reduce and prevent BV and yeast infections  Check your test results on MyChart You will be called if any of your tests are positive    ED Prescriptions    Medication Sig Dispense Auth. Provider   metroNIDAZOLE (FLAGYL) 500 MG tablet Take 1 tablet (500 mg total) by mouth 2 (two) times daily. 14 tablet Raylene Everts, MD   fluconazole (DIFLUCAN) 150 MG tablet Take  1 tablet (150 mg total) by mouth daily. Repeat in 1 week if needed 2 tablet Raylene Everts, MD     PDMP not reviewed this  encounter.   Raylene Everts, MD 01/09/20 2147

## 2020-01-09 NOTE — Discharge Instructions (Signed)
Take the antibiotics as directed Start taking a probiotic for women every day This may help reduce and prevent BV and yeast infections  Check your test results on MyChart You will be called if any of your tests are positive

## 2020-01-10 LAB — CERVICOVAGINAL ANCILLARY ONLY
Bacterial Vaginitis (gardnerella): POSITIVE — AB
Chlamydia: NEGATIVE
Comment: NEGATIVE
Comment: NEGATIVE
Comment: NEGATIVE
Comment: NORMAL
Neisseria Gonorrhea: NEGATIVE
Trichomonas: NEGATIVE

## 2020-06-17 ENCOUNTER — Encounter (HOSPITAL_COMMUNITY): Payer: Self-pay

## 2020-06-17 ENCOUNTER — Ambulatory Visit (HOSPITAL_COMMUNITY)
Admission: EM | Admit: 2020-06-17 | Discharge: 2020-06-17 | Disposition: A | Discharge status: Home / Self Care | Payer: Medicaid Other | Attending: Family Medicine | Admitting: Family Medicine

## 2020-06-17 ENCOUNTER — Other Ambulatory Visit: Payer: Self-pay

## 2020-06-17 DIAGNOSIS — N760 Acute vaginitis: Secondary | ICD-10-CM | POA: Insufficient documentation

## 2020-06-17 LAB — POCT URINALYSIS DIPSTICK, ED / UC
Bilirubin Urine: NEGATIVE
Glucose, UA: NEGATIVE mg/dL
Hgb urine dipstick: NEGATIVE
Ketones, ur: NEGATIVE mg/dL
Nitrite: NEGATIVE
Protein, ur: NEGATIVE mg/dL
Specific Gravity, Urine: 1.015 (ref 1.005–1.030)
Urobilinogen, UA: 0.2 mg/dL (ref 0.0–1.0)
pH: 6 (ref 5.0–8.0)

## 2020-06-17 LAB — POC URINE PREG, ED: Preg Test, Ur: NEGATIVE

## 2020-06-17 MED ORDER — NAPROXEN 500 MG PO TABS
500.0000 mg | ORAL_TABLET | Freq: Two times a day (BID) | ORAL | 0 refills | Status: DC
Start: 2020-06-17 — End: 2021-05-04

## 2020-06-17 MED ORDER — METRONIDAZOLE 500 MG PO TABS
500.0000 mg | ORAL_TABLET | Freq: Two times a day (BID) | ORAL | 0 refills | Status: DC
Start: 2020-06-17 — End: 2021-05-04

## 2020-06-17 NOTE — ED Provider Notes (Signed)
[place]    CSN: [id number] Arrival date & time: 06/17/20  1228      History   Chief Complaint Chief Complaint  Patient presents with  . Vaginal Discharge    HPI Doris Gonzalez is a 35 y.o. female.   Here today with about a month of vaginal discharge and irritation, odor. Denies abdominal pain, back pain, fever, N/V/D, known exposures to STIs. Not trying anything OTC for sxs. LMP 05/30/20.      Past Medical History:  Diagnosis Date  . Anxiety     There are no problems to display for this patient.   Past Surgical History:  Procedure Laterality Date  . DILATION AND CURETTAGE OF UTERUS      OB History   No obstetric history on file.      Home Medications    Prior to Admission medications   Medication Sig Start Date End Date Taking? Authorizing Provider  fluconazole (DIFLUCAN) 150 MG tablet Take 1 tablet (150 mg total) by mouth daily. Repeat in 1 week if needed 01/09/20   Raylene Everts, MD  metroNIDAZOLE (FLAGYL) 500 MG tablet Take 1 tablet (500 mg total) by mouth 2 (two) times daily. 06/17/20   Volney American, PA-C  naproxen (NAPROSYN) 500 MG tablet Take 1 tablet (500 mg total) by mouth 2 (two) times daily. 06/17/20   Volney American, PA-C  traMADol (ULTRAM) 50 MG tablet Take 1 tablet (50 mg total) by mouth every 6 (six) hours as needed for severe pain. 12/13/16   Katy Apo, NP    Family History Family History  Problem Relation Age of Onset  . Hypertension Other   . Diabetes Other   . Diabetes Mother   . Hypertension Mother   . Heart failure Father     Social History Social History   Tobacco Use  . Smoking status: Current Every Day Smoker    Packs/day: 1.00    Types: Cigarettes  . Smokeless tobacco: Never Used  Vaping Use  . Vaping Use: Never used  Substance Use Topics  . Alcohol use: Yes    Comment: occ  . Drug use: No     Allergies   Shellfish allergy   Review of Systems Review of Systems PER  HPI   Physical Exam Triage Vital Signs ED Triage Vitals  Enc Vitals Group     BP 06/17/20 1422 (!) 171/92     Pulse Rate 06/17/20 1422 (!) 59     Resp 06/17/20 1422 18     Temp 06/17/20 1422 99.3 F (37.4 C)     Temp Source 06/17/20 1422 Oral     SpO2 06/17/20 1422 99 %     Weight --      Height --      Head Circumference --      Peak Flow --      Pain Score 06/17/20 1418 5     Pain Loc --      Pain Edu? --      Excl. in [place]? --    No data found.  Updated Vital Signs BP (!) 171/92 (BP Location: Right Arm)   Pulse (!) 59   Temp 99.3 F (37.4 C) (Oral)   Resp 18   LMP 05/30/2020 (Approximate)   SpO2 99%   Visual Acuity Right Eye Distance:   Left Eye Distance:   Bilateral Distance:    Right Eye Near:   Left Eye Near:    Bilateral Near:  Physical Exam Vitals and nursing note reviewed.  Constitutional:      Appearance: Normal appearance. She is not ill-appearing.  HENT:     Head: Atraumatic.  Eyes:     Extraocular Movements: Extraocular movements intact.     Conjunctiva/sclera: Conjunctivae normal.  Cardiovascular:     Rate and Rhythm: Normal rate and regular rhythm.     Heart sounds: Normal heart sounds.  Pulmonary:     Effort: Pulmonary effort is normal.     Breath sounds: Normal breath sounds.  Abdominal:     General: Bowel sounds are normal. There is no distension.     Palpations: Abdomen is soft.     Tenderness: There is no abdominal tenderness. There is no right CVA tenderness, left CVA tenderness or guarding.  Musculoskeletal:        General: Normal range of motion.     Cervical back: Normal range of motion and neck supple.  Skin:    General: Skin is warm and dry.  Neurological:     Mental Status: She is alert and oriented to person, place, and time.  Psychiatric:        Mood and Affect: Mood normal.        Thought Content: Thought content normal.        Judgment: Judgment normal.      UC Treatments / Results  Labs (all labs ordered  are listed, but only abnormal results are displayed) Labs Reviewed  POCT URINALYSIS DIPSTICK, ED / UC - Abnormal; Notable for the following components:      Result Value   Leukocytes,Ua SMALL (*)    All other components within normal limits  URINE CULTURE  POC URINE PREG, ED  CERVICOVAGINAL ANCILLARY ONLY    EKG   Radiology No results found.  Procedures Procedures (including critical care time)  Medications Ordered in UC Medications - No data to display  Initial Impression / Assessment and Plan / UC Course  I have reviewed the triage vital signs and the nursing notes.  Pertinent labs & imaging results that were available during my care of the patient were reviewed by me and considered in my medical decision making (see chart for details).     U/A without clear evidence of UTI at this time, will send urine culture and aptima swab also pending. Urine preg neg. Will treat for BV given hx of same and consistent sxs while awaiting results. Discussed supportive care, return precautions.   Final Clinical Impressions(s) / UC Diagnoses   Final diagnoses:  Acute vaginitis   Discharge Instructions   None    ED Prescriptions    Medication Sig Dispense Auth. Provider   metroNIDAZOLE (FLAGYL) 500 MG tablet Take 1 tablet (500 mg total) by mouth 2 (two) times daily. 14 tablet Volney American, [place]   naproxen (NAPROSYN) 500 MG tablet Take 1 tablet (500 mg total) by mouth 2 (two) times daily. 30 tablet Volney American, [place]     PDMP not reviewed this encounter.   Volney American, [place] 06/17/20 1514

## 2020-06-17 NOTE — ED Triage Notes (Signed)
Pt c/o malodorous vaginal discharge, itching, and irritation for approx 1 month.  Also c/o pain to right side of chest that increases with inspiration and movement; pt attributes pain to "smoking too many cigarettes" Pain to upper right breast/chest reproducible with palpation.  Denies n/v/d, fever, dysuria sx.  Has been taking probiotic

## 2020-06-18 ENCOUNTER — Telehealth (HOSPITAL_COMMUNITY): Payer: Self-pay | Admitting: Emergency Medicine

## 2020-06-18 LAB — CERVICOVAGINAL ANCILLARY ONLY
Bacterial Vaginitis (gardnerella): POSITIVE — AB
Candida Glabrata: NEGATIVE
Candida Vaginitis: POSITIVE — AB
Chlamydia: NEGATIVE
Comment: NEGATIVE
Comment: NEGATIVE
Comment: NEGATIVE
Comment: NEGATIVE
Comment: NEGATIVE
Comment: NORMAL
Neisseria Gonorrhea: NEGATIVE
Trichomonas: POSITIVE — AB

## 2020-06-18 LAB — URINE CULTURE

## 2020-06-18 MED ORDER — FLUCONAZOLE 150 MG PO TABS
150.0000 mg | ORAL_TABLET | Freq: Once | ORAL | 0 refills | Status: AC
Start: 2020-06-18 — End: 2020-06-18

## 2021-05-04 ENCOUNTER — Other Ambulatory Visit: Payer: Self-pay

## 2021-05-04 ENCOUNTER — Encounter (HOSPITAL_COMMUNITY): Payer: Self-pay | Admitting: Emergency Medicine

## 2021-05-04 ENCOUNTER — Ambulatory Visit (HOSPITAL_COMMUNITY)
Admission: EM | Admit: 2021-05-04 | Discharge: 2021-05-04 | Disposition: A | Discharge status: Home / Self Care | Payer: Medicaid Other | Attending: Emergency Medicine | Admitting: Emergency Medicine

## 2021-05-04 DIAGNOSIS — N898 Other specified noninflammatory disorders of vagina: Secondary | ICD-10-CM | POA: Insufficient documentation

## 2021-05-04 DIAGNOSIS — B349 Viral infection, unspecified: Secondary | ICD-10-CM | POA: Insufficient documentation

## 2021-05-04 MED ORDER — ONDANSETRON HCL 4 MG PO TABS
4.0000 mg | ORAL_TABLET | Freq: Four times a day (QID) | ORAL | 0 refills | Status: DC
Start: 2021-05-04 — End: 2023-11-30

## 2021-05-04 MED ORDER — NAPROXEN 500 MG PO TABS
500.0000 mg | ORAL_TABLET | Freq: Two times a day (BID) | ORAL | 0 refills | Status: DC
Start: 2021-05-04 — End: 2023-11-30

## 2021-05-04 MED ORDER — LOPERAMIDE HCL 2 MG PO CAPS
2.0000 mg | ORAL_CAPSULE | Freq: Four times a day (QID) | ORAL | 0 refills | Status: DC | PRN
Start: 2021-05-04 — End: 2023-11-30

## 2021-05-04 MED ORDER — METRONIDAZOLE 500 MG PO TABS
500.0000 mg | ORAL_TABLET | Freq: Two times a day (BID) | ORAL | 0 refills | Status: DC
Start: 2021-05-04 — End: 2021-10-01

## 2021-05-04 NOTE — ED Triage Notes (Signed)
Pt is present today with abdominal pain, cough, sore throat, and diarrhea. Pt sx started x3 days ago.   Pt is also having vaginal discharge and odor. Pt sx started x3 months ago.

## 2021-05-04 NOTE — Discharge Instructions (Addendum)
Today you are being treated prophylactically for  Bacterial vaginosis   Take Metronidazole 500 mg twice a day for 7 days, do not drink alcohol while using medication, this will make you feel sick   Bacterial vaginosis which results from an overgrowth of one on several organisms that are normally present in your vagina. Vaginosis is an inflammation of the vagina that can result in discharge, itching and pain.  Labs pending 2-3 days, you will be contacted if positive for any sti and treatment will be sent to the pharmacy, you will have to return to the clinic if positive for gonorrhea to receive treatment   Please refrain from having sex until labs results, if positive please refrain from having sex until treatment complete and symptoms resolve   If positive for HIV, Syphilis, Chlamydia  gonorrhea or trichomoniasis please notify partner or partners so they may tested as well  Moving forward, it is recommended you use some form of protection against the transmission of sti infections  such as condoms or dental dams with each sexual encounter     In addition: Avoid baths, hot tubs and whirlpool spas.  Don't use scented or harsh soaps Avoid irritants. These include scented tampons and pads. Wipe from front to back after using the toilet. Don't douche. Your vagina doesn't require cleansing other than normal bathing.  Use a condom.  Wear cotton underwear, this fabric absorbs some moisture.     Your symptoms today are most likely being caused by a virus and should steadily improve in time it can take up to 7 to 10 days before you truly start to see a turnaround however things will get better  You may take Zofran every 6 hours as needed to help with nausea, wait 30 minutes to an hour to attempt to eat or drink after use      You can take Tylenol and/or Ibuprofen as needed for fever reduction and pain relief.   For cough: honey 1/2 to 1 teaspoon (you can dilute the honey in water or another  fluid).  You can also use guaifenesin and dextromethorphan for cough. You can use a humidifier for chest congestion and cough.  If you don't have a humidifier, you can sit in the bathroom with the hot shower running.      For sore throat: try warm salt water gargles, cepacol lozenges, throat spray, warm tea or water with lemon/honey, popsicles or ice, or OTC cold relief medicine for throat discomfort.   For congestion: take a daily anti-histamine like Zyrtec, Claritin, and a oral decongestant, such as pseudoephedrine.  You can also use Flonase 1-2 sprays in each nostril daily.   It is important to stay hydrated: drink plenty of fluids (water, gatorade/powerade/pedialyte, juices, or teas) to keep your throat moisturized and help further relieve irritation/discomfort.

## 2021-05-04 NOTE — ED Provider Notes (Signed)
[place]    CSN: [id number] Arrival date & time: 05/04/21  1212      History   Chief Complaint Chief Complaint  Patient presents with   Cough   Sore Throat   Diarrhea   Emesis   SEXUALLY TRANSMITTED DISEASE    HPI Doris Gonzalez is a 35 y.o. female.   Patient presents with generalized abdominal pain, diarrhea, cough, vomiting, for 3 days.  Last episode of diarrhea today. unable to tolerate food or liquids. No known sick contact, Has attempted use of alk selzter and theraflu which is not helpful.   Patient presents with vaginal discharge clear with odor and urinary frequency for 3 months. Has not attempted treatment. Sexually active, 1 partner, no condom use.   Past Medical History:  Diagnosis Date   Anxiety     There are no problems to display for this patient.   Past Surgical History:  Procedure Laterality Date   DILATION AND CURETTAGE OF UTERUS      OB History   No obstetric history on file.      Home Medications    Prior to Admission medications   Medication Sig Start Date End Date Taking? Authorizing Provider  metroNIDAZOLE (FLAGYL) 500 MG tablet Take 1 tablet (500 mg total) by mouth 2 (two) times daily. 06/17/20   Volney American, PA-C  naproxen (NAPROSYN) 500 MG tablet Take 1 tablet (500 mg total) by mouth 2 (two) times daily. 06/17/20   Volney American, PA-C  traMADol (ULTRAM) 50 MG tablet Take 1 tablet (50 mg total) by mouth every 6 (six) hours as needed for severe pain. 12/13/16   Katy Apo, NP    Family History Family History  Problem Relation Age of Onset   Hypertension Other    Diabetes Other    Diabetes Mother    Hypertension Mother    Heart failure Father     Social History Social History   Tobacco Use   Smoking status: Every Day    Packs/day: 1.00    Types: Cigarettes   Smokeless tobacco: Never  Vaping Use   Vaping Use: Never used  Substance Use Topics   Alcohol use: Yes    Comment: occ   Drug  use: No     Allergies   Shellfish allergy   Review of Systems Review of Systems  Constitutional: Negative.   HENT: Negative.    Respiratory: Negative.    Cardiovascular: Negative.   Gastrointestinal:  Positive for abdominal pain, diarrhea, nausea and vomiting. Negative for abdominal distention, anal bleeding, blood in stool, constipation and rectal pain.  Genitourinary:  Positive for frequency and vaginal discharge. Negative for decreased urine volume, difficulty urinating, dyspareunia, dysuria, enuresis, flank pain, genital sores, hematuria, menstrual problem, pelvic pain, urgency, vaginal bleeding and vaginal pain.  Musculoskeletal: Negative.   Skin: Negative.   Neurological: Negative.     Physical Exam Triage Vital Signs ED Triage Vitals  Enc Vitals Group     BP 05/04/21 1332 (!) 145/95     Pulse Rate 05/04/21 1332 (!) 104     Resp 05/04/21 1332 18     Temp 05/04/21 1332 98.1 F (36.7 C)     Temp Source 05/04/21 1332 Oral     SpO2 05/04/21 1332 98 %     Weight --      Height --      Head Circumference --      Peak Flow --      Pain Score  05/04/21 1331 6     Pain Loc --      Pain Edu? --      Excl. in [place]? --    No data found.  Updated Vital Signs BP (!) 145/95 (BP Location: Left Arm)   Pulse (!) 104   Temp 98.1 F (36.7 C) (Oral)   Resp 18   SpO2 98%   Visual Acuity Right Eye Distance:   Left Eye Distance:   Bilateral Distance:    Right Eye Near:   Left Eye Near:    Bilateral Near:     Physical Exam Constitutional:      Appearance: Normal appearance. She is well-developed and normal weight.  HENT:     Head: Normocephalic.     Right Ear: Tympanic membrane, ear canal and external ear normal.     Left Ear: Tympanic membrane, ear canal and external ear normal.     Nose: Nose normal.     Mouth/Throat:     Pharynx: Oropharynx is clear.  Eyes:     Extraocular Movements: Extraocular movements intact.  Cardiovascular:     Rate and Rhythm: Normal rate  and regular rhythm.     Pulses: Normal pulses.     Heart sounds: Normal heart sounds.  Pulmonary:     Effort: Pulmonary effort is normal.     Breath sounds: Normal breath sounds.  Abdominal:     General: Abdomen is flat. Bowel sounds are normal.     Palpations: Abdomen is soft.     Tenderness: There is abdominal tenderness in the right lower quadrant. There is no right CVA tenderness, left CVA tenderness or guarding. Negative signs include Murphy's sign.  Musculoskeletal:     Cervical back: Normal range of motion and neck supple.  Skin:    General: Skin is warm and dry.  Neurological:     General: No focal deficit present.     Mental Status: She is alert and oriented to person, place, and time. Mental status is at baseline.  Psychiatric:        Mood and Affect: Mood normal.        Behavior: Behavior normal.     UC Treatments / Results  Labs (all labs ordered are listed, but only abnormal results are displayed) Labs Reviewed - No data to display  EKG   Radiology No results found.  Procedures Procedures (including critical care time)  Medications Ordered in UC Medications - No data to display  Initial Impression / Assessment and Plan / UC Course  I have reviewed the triage vital signs and the nursing notes.  Pertinent labs & imaging results that were available during my care of the patient were reviewed by me and considered in my medical decision making (see chart for details).  Viral illness Vaginal discharge  We will treat prophylactically for BV as patient is gets these infections often, metronidazole 500 mg twice daily for 7 days prescribed, recommended boric acid suppositories as preventative treatment moving forward, STI screening pending, will treat per protocol, advised abstinence until all symptoms resolved and all treatment is complete, patient requested refill on naproxen, will prescribe, will manage remaining symptoms conservatively, most likely etiology  viral, imodium and zofran prescribed to be used as needed, and advised patient to increase fluid intake, urgent care follow-up as needed Final Clinical Impressions(s) / UC Diagnoses   Final diagnoses:  None   Discharge Instructions   None    ED Prescriptions   None    PDMP  not reviewed this encounter.   Hans Eden, NP 05/04/21 [phone]

## 2021-05-05 LAB — CERVICOVAGINAL ANCILLARY ONLY
Bacterial Vaginitis (gardnerella): POSITIVE — AB
Candida Glabrata: NEGATIVE
Candida Vaginitis: NEGATIVE
Chlamydia: NEGATIVE
Comment: NEGATIVE
Comment: NEGATIVE
Comment: NEGATIVE
Comment: NEGATIVE
Comment: NEGATIVE
Comment: NORMAL
Neisseria Gonorrhea: NEGATIVE
Trichomonas: NEGATIVE

## 2021-05-06 ENCOUNTER — Telehealth (HOSPITAL_COMMUNITY): Payer: Self-pay | Admitting: Emergency Medicine

## 2021-05-06 NOTE — Telephone Encounter (Signed)
Opened in error

## 2021-09-13 ENCOUNTER — Emergency Department (HOSPITAL_BASED_OUTPATIENT_CLINIC_OR_DEPARTMENT_OTHER): Payer: Medicaid Other | Admitting: Radiology

## 2021-09-13 ENCOUNTER — Emergency Department (HOSPITAL_BASED_OUTPATIENT_CLINIC_OR_DEPARTMENT_OTHER)
Admission: EM | Admit: 2021-09-13 | Discharge: 2021-09-13 | Disposition: A | Discharge status: Home / Self Care | Payer: Medicaid Other | Attending: Emergency Medicine | Admitting: Emergency Medicine

## 2021-09-13 ENCOUNTER — Encounter (HOSPITAL_BASED_OUTPATIENT_CLINIC_OR_DEPARTMENT_OTHER): Payer: Self-pay

## 2021-09-13 ENCOUNTER — Other Ambulatory Visit (HOSPITAL_BASED_OUTPATIENT_CLINIC_OR_DEPARTMENT_OTHER): Payer: Self-pay

## 2021-09-13 ENCOUNTER — Other Ambulatory Visit: Payer: Self-pay

## 2021-09-13 DIAGNOSIS — S299XXA Unspecified injury of thorax, initial encounter: Secondary | ICD-10-CM | POA: Diagnosis present

## 2021-09-13 DIAGNOSIS — S2242XA Multiple fractures of ribs, left side, initial encounter for closed fracture: Secondary | ICD-10-CM | POA: Insufficient documentation

## 2021-09-13 MED ORDER — OXYCODONE HCL 5 MG PO TABS
2.5000 mg | ORAL_TABLET | ORAL | 0 refills | Status: DC | PRN
  Filled 2021-09-13: qty 12, 2d supply, fill #0

## 2021-09-13 MED ORDER — OXYCODONE-ACETAMINOPHEN 5-325 MG PO TABS
2.0000 | ORAL_TABLET | Freq: Once | ORAL | Status: AC
  Administered 2021-09-13: 2 via ORAL
  Filled 2021-09-13: qty 2

## 2021-09-13 NOTE — Discharge Instructions (Signed)
Contact a health care provider if: You have a fever. Get help right away if: You have difficulty breathing or you are short of breath. You develop a cough that does not stop, or you cough up thick or bloody sputum. You have nausea, vomiting, or pain in your abdomen. Your pain gets worse and medicine does not help. 
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
## 2021-09-29 ENCOUNTER — Ambulatory Visit (INDEPENDENT_AMBULATORY_CARE_PROVIDER_SITE_OTHER): Payer: Medicaid Other

## 2021-09-29 ENCOUNTER — Encounter (HOSPITAL_COMMUNITY): Payer: Self-pay | Admitting: Emergency Medicine

## 2021-09-29 ENCOUNTER — Ambulatory Visit (HOSPITAL_COMMUNITY)
Admission: EM | Admit: 2021-09-29 | Discharge: 2021-09-29 | Disposition: A | Discharge status: Home / Self Care | Payer: Medicaid Other | Attending: Internal Medicine | Admitting: Internal Medicine

## 2021-09-29 DIAGNOSIS — S2242XD Multiple fractures of ribs, left side, subsequent encounter for fracture with routine healing: Secondary | ICD-10-CM | POA: Insufficient documentation

## 2021-09-29 DIAGNOSIS — R0782 Intercostal pain: Secondary | ICD-10-CM | POA: Diagnosis not present

## 2021-09-29 DIAGNOSIS — R042 Hemoptysis: Secondary | ICD-10-CM | POA: Diagnosis not present

## 2021-09-29 DIAGNOSIS — N898 Other specified noninflammatory disorders of vagina: Secondary | ICD-10-CM | POA: Diagnosis not present

## 2021-09-29 MED ORDER — IBUPROFEN 600 MG PO TABS: 600.0000 mg | ORAL_TABLET | Freq: Four times a day (QID) | ORAL | 0 refills | Status: DC | PRN

## 2021-09-29 MED ORDER — ACETAMINOPHEN 500 MG PO TABS: 500.0000 mg | ORAL_TABLET | Freq: Four times a day (QID) | ORAL | 0 refills | Status: DC | PRN

## 2021-09-29 MED ORDER — TRAMADOL HCL 50 MG PO TABS: 50.0000 mg | ORAL_TABLET | Freq: Four times a day (QID) | ORAL | 0 refills | Status: DC | PRN
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
## 2021-09-29 NOTE — ED Triage Notes (Signed)
Pt reports broke ribs and was told if coughing up any blood then need to be seen. Reports coughing up blood since yesterday.  ?
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
## 2021-09-30 LAB — CERVICOVAGINAL ANCILLARY ONLY
Bacterial Vaginitis (gardnerella): POSITIVE — AB
Candida Glabrata: NEGATIVE
Candida Vaginitis: NEGATIVE
Chlamydia: NEGATIVE
Comment: NEGATIVE
Comment: NEGATIVE
Comment: NEGATIVE
Comment: NEGATIVE
Comment: NEGATIVE
Comment: NORMAL
Neisseria Gonorrhea: NEGATIVE
Trichomonas: POSITIVE — AB

## 2021-10-01 ENCOUNTER — Telehealth (HOSPITAL_COMMUNITY): Payer: Self-pay | Admitting: Emergency Medicine

## 2021-10-01 MED ORDER — METRONIDAZOLE 500 MG PO TABS: 500.0000 mg | ORAL_TABLET | Freq: Two times a day (BID) | ORAL | 0 refills | Status: DC

## 2023-05-25 ENCOUNTER — Encounter (HOSPITAL_COMMUNITY): Payer: Self-pay

## 2023-05-25 ENCOUNTER — Other Ambulatory Visit: Payer: Self-pay

## 2023-05-25 ENCOUNTER — Emergency Department (HOSPITAL_COMMUNITY)
Admission: EM | Admit: 2023-05-25 | Discharge: 2023-05-25 | Disposition: A | Discharge status: Home / Self Care | Payer: Medicaid Other | Attending: Emergency Medicine | Admitting: Emergency Medicine

## 2023-05-25 ENCOUNTER — Emergency Department (HOSPITAL_COMMUNITY): Payer: Medicaid Other

## 2023-05-25 DIAGNOSIS — F10920 Alcohol use, unspecified with intoxication, uncomplicated: Secondary | ICD-10-CM | POA: Insufficient documentation

## 2023-05-25 DIAGNOSIS — Y908 Blood alcohol level of 240 mg/100 ml or more: Secondary | ICD-10-CM | POA: Diagnosis not present

## 2023-05-25 LAB — BASIC METABOLIC PANEL
Anion gap: 13 (ref 5–15)
BUN: 14 mg/dL (ref 6–20)
CO2: 22 mmol/L (ref 22–32)
Calcium: 9.1 mg/dL (ref 8.9–10.3)
Chloride: 104 mmol/L (ref 98–111)
Creatinine, Ser: 0.77 mg/dL (ref 0.44–1.00)
GFR, Estimated: >60 mL/min (ref >60)
Glucose, Bld: 90 mg/dL (ref 70–99)
Potassium: 3.8 mmol/L (ref 3.5–5.1)
Sodium: 139 mmol/L (ref 135–145)

## 2023-05-25 LAB — ETHANOL: Alcohol, Ethyl (B): 425 mg/dL (ref <10)

## 2023-05-25 LAB — CBC
HCT: 38.4 % (ref 36.0–46.0)
Hemoglobin: 12.5 g/dL (ref 12.0–15.0)
MCH: 31.3 pg (ref 26.0–34.0)
MCHC: 32.6 g/dL (ref 30.0–36.0)
MCV: 96 fL (ref 80.0–100.0)
Platelets: 216 10*3/uL (ref 150–400)
RBC: 4 MIL/uL (ref 3.87–5.11)
RDW: 12.4 % (ref 11.5–15.5)
WBC: 6.1 10*3/uL (ref 4.0–10.5)
nRBC: 0 % (ref 0.0–0.2)

## 2023-05-25 LAB — CBG MONITORING, ED: Glucose-Capillary: 90 mg/dL (ref 70–99)

## 2023-05-25 LAB — HCG, QUANTITATIVE, PREGNANCY: hCG, Beta Chain, Quant, S: <1 m[IU]/mL (ref <5)
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
## 2023-05-25 NOTE — ED Notes (Signed)
Pt woke up, ambulated to restroom and back to bed without assistance
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
## 2023-05-25 NOTE — ED Provider Notes (Signed)
[place] EMERGENCY DEPARTMENT AT [facility] Provider Note   CSN: [id number] Arrival date & time: 05/25/23  1204     History  Chief Complaint  Patient presents with   Alcohol Intoxication    Doris Gonzalez is a 37 y.o. female.  37 yo F with a chief complaint of altered mental status.  Patient was thought to be acutely intoxicated.  Patient is very sleepy on my exam and unable to provide any history..  Level 5 caveat   Alcohol Intoxication       Home Medications Prior to Admission medications   Medication Sig Start Date End Date Taking? Authorizing Provider  acetaminophen (TYLENOL) 500 MG tablet Take 1 tablet (500 mg total) by mouth every 6 (six) hours as needed. 09/29/21   Carlisle Beers, FNP  ibuprofen (ADVIL) 600 MG tablet Take 1 tablet (600 mg total) by mouth every 6 (six) hours as needed. 09/29/21   Carlisle Beers, FNP  loperamide (IMODIUM) 2 MG capsule Take 1 capsule (2 mg total) by mouth 4 (four) times daily as needed for diarrhea or loose stools. 05/04/21   White, Elita Boone, NP  metroNIDAZOLE (FLAGYL) 500 MG tablet Take 1 tablet (500 mg total) by mouth 2 (two) times daily. 10/01/21   Lamptey, Britta Mccreedy, MD  naproxen (NAPROSYN) 500 MG tablet Take 1 tablet (500 mg total) by mouth 2 (two) times daily. 05/04/21   White, Elita Boone, NP  ondansetron (ZOFRAN) 4 MG tablet Take 1 tablet (4 mg total) by mouth every 6 (six) hours. 05/04/21   White, Elita Boone, NP  oxyCODONE (ROXICODONE) 5 MG immediate release tablet Take 0.5-1 tablets (2.5-5 mg total) by mouth every 4 (four) hours as needed for severe pain. 09/13/21   Arthor Captain, PA-C      Allergies    Shellfish allergy and Shellfish-derived products    Review of Systems   Review of Systems  Physical Exam Updated Vital Signs BP 118/86   Pulse 78   Resp 18   Ht 5' (1.524 m)   Wt 62 kg   SpO2 98%   BMI 26.69 kg/m  Physical Exam Vitals and nursing note reviewed.  Constitutional:      General:  She is not in acute distress.    Appearance: She is well-developed. She is not diaphoretic.  HENT:     Head: Normocephalic and atraumatic.  Eyes:     Pupils: Pupils are equal, round, and reactive to light.  Cardiovascular:     Rate and Rhythm: Normal rate and regular rhythm.     Heart sounds: No murmur heard.    No friction rub. No gallop.  Pulmonary:     Effort: Pulmonary effort is normal.     Breath sounds: No wheezing or rales.  Abdominal:     General: There is no distension.     Palpations: Abdomen is soft.     Tenderness: There is no abdominal tenderness.  Musculoskeletal:        General: No tenderness.     Cervical back: Normal range of motion and neck supple.  Skin:    General: Skin is warm and dry.  Neurological:     Mental Status: She is alert and oriented to person, place, and time.  Psychiatric:        Behavior: Behavior normal.     ED Results / Procedures / Treatments   Labs (all labs ordered are listed, but only abnormal results are displayed) Labs Reviewed  ETHANOL -  Abnormal; Notable for the following components:      Result Value   Alcohol, Ethyl (B) 425 (*)    All other components within normal limits  CBC  BASIC METABOLIC PANEL  HCG, QUANTITATIVE, PREGNANCY  CBG MONITORING, ED    EKG None  Radiology CT Maxillofacial Wo Contrast Result Date: 05/25/2023 CLINICAL DATA:  Provided history: Head trauma, moderate/severe. Facial trauma, blunt. Additional history obtained from electronic MEDICAL RECORD NUMBERFall. EXAM: CT HEAD WITHOUT CONTRAST CT MAXILLOFACIAL WITHOUT CONTRAST TECHNIQUE: Multidetector CT imaging of the head and maxillofacial structures were performed using the standard protocol without intravenous contrast. Multiplanar CT image reconstructions of the maxillofacial structures were also generated. RADIATION DOSE REDUCTION: This exam was performed according to the departmental dose-optimization program which includes automated exposure control,  adjustment of the mA and/or kV according to patient size and/or use of iterative reconstruction technique. COMPARISON:  Head CT 10/09/2019. FINDINGS: CT HEAD FINDINGS Brain: Generalized cerebral atrophy, mild but unexpected for age. There is no acute intracranial hemorrhage. No demarcated cortical infarct. No extra-axial fluid collection. No evidence of an intracranial mass. No midline shift. Vascular: No hyperdense vessel. Skull: No calvarial fracture or aggressive osseous lesion. CT MAXILLOFACIAL FINDINGS The examination is mildly motion degraded at the level of the mandible. Within this limitation for findings are follows. Osseous: No acute maxillofacial fracture is identified. Orbits: Left periorbital soft tissue swelling/hematoma. No acute finding within the orbits. Sinuses: No significant paranasal sinus disease. Soft tissues: Left periorbital soft tissue swelling/hematoma. IMPRESSION: CT head: 1.  No evidence of an acute intracranial abnormality. 2. Generalized cerebral atrophy, mild but unexpected for age. CT maxillofacial: 1. The examination is mildly motion degraded at the level of the mandible. 2. No evidence of an acute maxillofacial fracture. 3. Left periorbital soft tissue swelling/hematoma. Electronically Signed   By: Jackey Loge D.O.   On: 05/25/2023 14:05   CT Head Wo Contrast Result Date: 05/25/2023 CLINICAL DATA:  Provided history: Head trauma, moderate/severe. Facial trauma, blunt. Additional history obtained from electronic MEDICAL RECORD NUMBERFall. EXAM: CT HEAD WITHOUT CONTRAST CT MAXILLOFACIAL WITHOUT CONTRAST TECHNIQUE: Multidetector CT imaging of the head and maxillofacial structures were performed using the standard protocol without intravenous contrast. Multiplanar CT image reconstructions of the maxillofacial structures were also generated. RADIATION DOSE REDUCTION: This exam was performed according to the departmental dose-optimization program which includes automated exposure control,  adjustment of the mA and/or kV according to patient size and/or use of iterative reconstruction technique. COMPARISON:  Head CT 10/09/2019. FINDINGS: CT HEAD FINDINGS Brain: Generalized cerebral atrophy, mild but unexpected for age. There is no acute intracranial hemorrhage. No demarcated cortical infarct. No extra-axial fluid collection. No evidence of an intracranial mass. No midline shift. Vascular: No hyperdense vessel. Skull: No calvarial fracture or aggressive osseous lesion. CT MAXILLOFACIAL FINDINGS The examination is mildly motion degraded at the level of the mandible. Within this limitation for findings are follows. Osseous: No acute maxillofacial fracture is identified. Orbits: Left periorbital soft tissue swelling/hematoma. No acute finding within the orbits. Sinuses: No significant paranasal sinus disease. Soft tissues: Left periorbital soft tissue swelling/hematoma. IMPRESSION: CT head: 1.  No evidence of an acute intracranial abnormality. 2. Generalized cerebral atrophy, mild but unexpected for age. CT maxillofacial: 1. The examination is mildly motion degraded at the level of the mandible. 2. No evidence of an acute maxillofacial fracture. 3. Left periorbital soft tissue swelling/hematoma. Electronically Signed   By: Jackey Loge D.O.   On: 05/25/2023 14:05    Procedures Procedures  Medications Ordered in ED Medications - No data to display  ED Course/ Medical Decision Making/ A&P                                 Medical Decision Making  37 yo F with a chief complaints of altered mental status.  Reportedly intoxicated.  Patient had blood work and CT imaging obtained through the MSE process.  I do not appreciate any obvious signs of trauma.  She was palpated from head to toe without any obvious noted areas of tenderness.  She was tachycardic on arrival although also had resolved by the time my exam.  Awaiting blood work.  Patient's alcohol level is consistent with acute alcohol  intoxication.  She continues to be resting comfortably.  No leukocytosis no anemia, no significant electrolyte abnormalities.  Awaiting for clinical sobriety.  Patient care was signed out to Dr. Wallace Cullens, please see their note for further details care in the ED.  The patients results and plan were reviewed and discussed.   Any x-rays performed were independently reviewed by myself.   Differential diagnosis were considered with the presenting HPI.  Medications - No data to display  Vitals:   05/25/23 1217 05/25/23 1459  BP: (!) 134/119 118/86  Pulse: (!) 128 78  Resp: (!) 22 18  TempSrc: Oral   SpO2: 92% 98%  Weight:  62 kg  Height:  5' (1.524 m)    Final diagnoses:  Alcoholic intoxication without complication (HCC)          Final Clinical Impression(s) / ED Diagnoses Final diagnoses:  Alcoholic intoxication without complication [facility])    Rx / DC Orders ED Discharge Orders     None         Melene Plan, DO 05/25/23 1631

## 2023-05-25 NOTE — ED Provider Notes (Signed)
4:13 PM Patient signed out to me by previous ED physician. Pt intoxicated with a bal 425. Pending dc when awake and able to ambulate.     Physical Exam  BP 118/86   Pulse 78   Resp 18   Ht 5' (1.524 m)   Wt 62 kg   SpO2 98%   BMI 26.69 kg/m   Physical Exam  Procedures  Procedures  ED Course / MDM    Medical Decision Making  7:52 PM Patient is awake and alert at this time.  Walking around the emergency department without any difficulties with ambulation.  Safe for discharge home with recommendations to decrease alcohol consumption.  Patient in no distress and overall condition improved here in the ED. Detailed discussions were had with the patient regarding current findings, and need for close f/u with PCP or on call doctor. The patient has been instructed to return immediately if the symptoms worsen in any way for re-evaluation. Patient verbalized understanding and is in agreement with current care plan. All questions answered prior to discharge.        Franne Forts, DO 05/25/23 [phone]
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
## 2023-07-08 ENCOUNTER — Emergency Department (HOSPITAL_COMMUNITY)
Admission: EM | Admit: 2023-07-08 | Discharge: 2023-07-09 | Disposition: A | Discharge status: Home / Self Care | Payer: Medicaid Other | Attending: Emergency Medicine | Admitting: Emergency Medicine

## 2023-07-08 ENCOUNTER — Other Ambulatory Visit: Payer: Self-pay

## 2023-07-08 DIAGNOSIS — Y908 Blood alcohol level of 240 mg/100 ml or more: Secondary | ICD-10-CM | POA: Insufficient documentation

## 2023-07-08 DIAGNOSIS — F10920 Alcohol use, unspecified with intoxication, uncomplicated: Secondary | ICD-10-CM | POA: Insufficient documentation

## 2023-07-08 LAB — CBC WITH DIFFERENTIAL/PLATELET
Abs Immature Granulocytes: 0.04 10*3/uL (ref 0.00–0.07)
Basophils Absolute: 0 10*3/uL (ref 0.0–0.1)
Basophils Relative: 1 %
Eosinophils Absolute: 0.1 10*3/uL (ref 0.0–0.5)
Eosinophils Relative: 2 %
HCT: 32.6 % — ABNORMAL LOW (ref 36.0–46.0)
Hemoglobin: 10.8 g/dL — ABNORMAL LOW (ref 12.0–15.0)
Immature Granulocytes: 1 %
Lymphocytes Relative: 63 %
Lymphs Abs: 2.5 10*3/uL (ref 0.7–4.0)
MCH: 30.7 pg (ref 26.0–34.0)
MCHC: 33.1 g/dL (ref 30.0–36.0)
MCV: 92.6 fL (ref 80.0–100.0)
Monocytes Absolute: 0.3 10*3/uL (ref 0.1–1.0)
Monocytes Relative: 9 %
Neutro Abs: 0.9 10*3/uL — ABNORMAL LOW (ref 1.7–7.7)
Neutrophils Relative %: 24 %
Platelets: 334 10*3/uL (ref 150–400)
RBC: 3.52 MIL/uL — ABNORMAL LOW (ref 3.87–5.11)
RDW: 11.6 % (ref 11.5–15.5)
WBC: 4 10*3/uL (ref 4.0–10.5)
nRBC: 0 % (ref 0.0–0.2)

## 2023-07-08 LAB — COMPREHENSIVE METABOLIC PANEL
ALT: 16 U/L (ref 0–44)
AST: 24 U/L (ref 15–41)
Albumin: 3.6 g/dL (ref 3.5–5.0)
Alkaline Phosphatase: 49 U/L (ref 38–126)
Anion gap: 11 (ref 5–15)
BUN: 5 mg/dL — ABNORMAL LOW (ref 6–20)
CO2: 22 mmol/L (ref 22–32)
Calcium: 8.7 mg/dL — ABNORMAL LOW (ref 8.9–10.3)
Chloride: 104 mmol/L (ref 98–111)
Creatinine, Ser: 0.71 mg/dL (ref 0.44–1.00)
GFR, Estimated: >60 mL/min (ref >60)
Glucose, Bld: 104 mg/dL — ABNORMAL HIGH (ref 70–99)
Potassium: 4 mmol/L (ref 3.5–5.1)
Sodium: 137 mmol/L (ref 135–145)
Total Bilirubin: 0.4 mg/dL (ref 0.0–1.2)
Total Protein: 8.3 g/dL — ABNORMAL HIGH (ref 6.5–8.1)

## 2023-07-08 LAB — HCG, SERUM, QUALITATIVE: Preg, Serum: NEGATIVE

## 2023-07-08 LAB — ETHANOL: Alcohol, Ethyl (B): 401 mg/dL (ref <10)

## 2023-07-08 LAB — CBG MONITORING, ED: Glucose-Capillary: 106 mg/dL — ABNORMAL HIGH (ref 70–99)

## 2023-07-08 MED ORDER — SODIUM CHLORIDE 0.9 % IV BOLUS
1000.0000 mL | Freq: Once | INTRAVENOUS | Status: AC
  Administered 2023-07-08: 1000 mL via INTRAVENOUS

## 2023-07-08 MED ORDER — THIAMINE HCL 100 MG/ML IJ SOLN
100.0000 mg | Freq: Every day | INTRAMUSCULAR | Status: DC
  Administered 2023-07-08: 100 mg via INTRAVENOUS
  Filled 2023-07-08: qty 2

## 2023-07-08 MED ORDER — LORAZEPAM 2 MG/ML IJ SOLN
2.0000 mg | Freq: Once | INTRAMUSCULAR | Status: AC
  Administered 2023-07-08: 2 mg via INTRAMUSCULAR
  Filled 2023-07-08: qty 1
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
## 2023-07-08 NOTE — ED Notes (Signed)
 Pt continues to wake up, eat a few bites of food, and return to sleeping. Provider advised of pt status.

## 2023-07-08 NOTE — ED Notes (Signed)
 Pt assisted to bathroom as pt was unsteady. Pt given new brief. Urine sample unable to be obtained due to pt urinating in the brief.
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
## 2023-07-19 ENCOUNTER — Emergency Department (HOSPITAL_COMMUNITY)
Admission: EM | Admit: 2023-07-19 | Discharge: 2023-07-20 | Disposition: A | Discharge status: Home / Self Care | Payer: Medicaid Other | Attending: Emergency Medicine | Admitting: Emergency Medicine

## 2023-07-19 ENCOUNTER — Encounter (HOSPITAL_COMMUNITY): Payer: Self-pay

## 2023-07-19 ENCOUNTER — Emergency Department (HOSPITAL_COMMUNITY): Payer: Medicaid Other

## 2023-07-19 ENCOUNTER — Other Ambulatory Visit: Payer: Self-pay

## 2023-07-19 DIAGNOSIS — F10120 Alcohol abuse with intoxication, uncomplicated: Secondary | ICD-10-CM | POA: Diagnosis not present

## 2023-07-19 DIAGNOSIS — Y908 Blood alcohol level of 240 mg/100 ml or more: Secondary | ICD-10-CM | POA: Insufficient documentation

## 2023-07-19 DIAGNOSIS — R4182 Altered mental status, unspecified: Secondary | ICD-10-CM | POA: Diagnosis present

## 2023-07-19 DIAGNOSIS — F10920 Alcohol use, unspecified with intoxication, uncomplicated: Secondary | ICD-10-CM

## 2023-07-19 LAB — COMPREHENSIVE METABOLIC PANEL
ALT: 22 U/L (ref 0–44)
AST: 46 U/L — ABNORMAL HIGH (ref 15–41)
Albumin: 3.7 g/dL (ref 3.5–5.0)
Alkaline Phosphatase: 41 U/L (ref 38–126)
Anion gap: 16 — ABNORMAL HIGH (ref 5–15)
BUN: 9 mg/dL (ref 6–20)
CO2: 19 mmol/L — ABNORMAL LOW (ref 22–32)
Calcium: 9 mg/dL (ref 8.9–10.3)
Chloride: 101 mmol/L (ref 98–111)
Creatinine, Ser: 0.69 mg/dL (ref 0.44–1.00)
GFR, Estimated: >60 mL/min (ref >60)
Glucose, Bld: 101 mg/dL — ABNORMAL HIGH (ref 70–99)
Potassium: 3.3 mmol/L — ABNORMAL LOW (ref 3.5–5.1)
Sodium: 136 mmol/L (ref 135–145)
Total Bilirubin: 0.3 mg/dL (ref 0.0–1.2)
Total Protein: 8.3 g/dL — ABNORMAL HIGH (ref 6.5–8.1)

## 2023-07-19 LAB — URINALYSIS, ROUTINE W REFLEX MICROSCOPIC
Bilirubin Urine: NEGATIVE
Glucose, UA: NEGATIVE mg/dL
Hgb urine dipstick: NEGATIVE
Ketones, ur: NEGATIVE mg/dL
Leukocytes,Ua: NEGATIVE
Nitrite: NEGATIVE
Protein, ur: NEGATIVE mg/dL
Specific Gravity, Urine: 1.002 — ABNORMAL LOW (ref 1.005–1.030)
pH: 5 (ref 5.0–8.0)

## 2023-07-19 LAB — CBC
HCT: 37.3 % (ref 36.0–46.0)
Hemoglobin: 12.3 g/dL (ref 12.0–15.0)
MCH: 31 pg (ref 26.0–34.0)
MCHC: 33 g/dL (ref 30.0–36.0)
MCV: 94 fL (ref 80.0–100.0)
Platelets: 292 10*3/uL (ref 150–400)
RBC: 3.97 MIL/uL (ref 3.87–5.11)
RDW: 12.2 % (ref 11.5–15.5)
WBC: 5.3 10*3/uL (ref 4.0–10.5)
nRBC: 0 % (ref 0.0–0.2)

## 2023-07-19 LAB — RAPID URINE DRUG SCREEN, HOSP PERFORMED
Amphetamines: NOT DETECTED
Barbiturates: NOT DETECTED
Benzodiazepines: POSITIVE — AB
Cocaine: POSITIVE — AB
Opiates: NOT DETECTED
Tetrahydrocannabinol: NOT DETECTED

## 2023-07-19 LAB — CBG MONITORING, ED: Glucose-Capillary: 137 mg/dL — ABNORMAL HIGH (ref 70–99)

## 2023-07-19 LAB — HCG, SERUM, QUALITATIVE: Preg, Serum: NEGATIVE

## 2023-07-19 LAB — ETHANOL: Alcohol, Ethyl (B): 336 mg/dL (ref <10)

## 2023-07-19 NOTE — Discharge Instructions (Addendum)
Reduce your alcohol intake.  Follow-up with your doctor.  Return to the ED with new or worsening symptoms.
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
## 2023-07-19 NOTE — ED Notes (Signed)
 NPA inserted to right nare for snoring respirations.

## 2023-07-19 NOTE — ED Notes (Signed)
 CRITICAL VALUE STICKER  CRITICAL VALUE:Etoh 336  RECEIVER (on-site recipient of call):I. Jesyca Weisenburger  DATE & TIME NOTIFIED: 07/19/23 1610  MESSENGER (representative from lab):lab  MD NOTIFIED: [place]  TIME OF NOTIFICATION:1610  RESPONSE:
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
## 2023-07-20 NOTE — ED Provider Notes (Signed)
 Care assumed from Dr. Doran Durand. Patient with alcohol intoxication. Aggressive behavior. Sedated with haldol and versed.   Workup remarkable for alcohol intoxication and cocaine intoxication.  Imaging is negative.  At 6 AM, patient is awake, answering questions, oriented to person and place.  She denies any suicidal or homicidal ideation.  She will be stable for discharge when she has a sober ride.  BP 115/85   Pulse 85   Temp 98.6 F (37 C) (Oral)   Resp 15   LMP  (LMP Unknown)   SpO2 98%     Glynn Octave, MD 07/20/23 [phone]

## 2023-07-20 NOTE — ED Notes (Signed)
 Patient given water, patient able to follow commands to drink water.

## 2023-07-20 NOTE — ED Notes (Addendum)
 Patient was able to ambulate from room, down the hall, and back to room. Patient had shuffling gait and noted dizziness. EDP Rancour, Jeannett Senior, MD notified

## 2023-08-16 ENCOUNTER — Telehealth: Payer: Self-pay

## 2023-08-16 ENCOUNTER — Other Ambulatory Visit (HOSPITAL_COMMUNITY): Payer: Self-pay

## 2023-08-16 NOTE — Telephone Encounter (Signed)
 Pharmacy Patient Advocate Encounter  Insurance verification completed.   The patient is insured through [place]   Ran test claim for [employer]. Currently a quantity of 84 is a 28 day supply and the co-pay is $4.00 . [employer] $4.00  This test claim was processed through [facility]- copay amounts may vary at other pharmacies due to pharmacy/plan contracts, or as the patient moves through the different stages of their insurance plan.

## 2023-08-23 ENCOUNTER — Encounter: Payer: MEDICAID | Admitting: Family

## 2023-09-19 ENCOUNTER — Emergency Department (HOSPITAL_COMMUNITY)
Admission: EM | Admit: 2023-09-19 | Discharge: 2023-09-19 | Disposition: A | Discharge status: Home / Self Care | Payer: MEDICAID | Attending: Emergency Medicine | Admitting: Emergency Medicine

## 2023-09-19 ENCOUNTER — Emergency Department (HOSPITAL_COMMUNITY): Payer: MEDICAID

## 2023-09-19 DIAGNOSIS — S0083XA Contusion of other part of head, initial encounter: Secondary | ICD-10-CM | POA: Insufficient documentation

## 2023-09-19 DIAGNOSIS — F10920 Alcohol use, unspecified with intoxication, uncomplicated: Secondary | ICD-10-CM | POA: Insufficient documentation

## 2023-09-19 DIAGNOSIS — Y907 Blood alcohol level of 200-239 mg/100 ml: Secondary | ICD-10-CM | POA: Insufficient documentation

## 2023-09-19 DIAGNOSIS — H7292 Unspecified perforation of tympanic membrane, left ear: Secondary | ICD-10-CM | POA: Diagnosis not present

## 2023-09-19 DIAGNOSIS — R519 Headache, unspecified: Secondary | ICD-10-CM | POA: Diagnosis present

## 2023-09-19 LAB — CBC
HCT: 41.6 % (ref 36.0–46.0)
Hemoglobin: 13.3 g/dL (ref 12.0–15.0)
MCH: 31 pg (ref 26.0–34.0)
MCHC: 32 g/dL (ref 30.0–36.0)
MCV: 97 fL (ref 80.0–100.0)
Platelets: 261 10*3/uL (ref 150–400)
RBC: 4.29 MIL/uL (ref 3.87–5.11)
RDW: 13.1 % (ref 11.5–15.5)
WBC: 8.4 10*3/uL (ref 4.0–10.5)
nRBC: 0 % (ref 0.0–0.2)

## 2023-09-19 LAB — COMPREHENSIVE METABOLIC PANEL WITH GFR
ALT: 37 U/L (ref 0–44)
AST: 41 U/L (ref 15–41)
Albumin: 3.7 g/dL (ref 3.5–5.0)
Alkaline Phosphatase: 70 U/L (ref 38–126)
Anion gap: 15 (ref 5–15)
BUN: 8 mg/dL (ref 6–20)
CO2: 17 mmol/L — ABNORMAL LOW (ref 22–32)
Calcium: 8.6 mg/dL — ABNORMAL LOW (ref 8.9–10.3)
Chloride: 102 mmol/L (ref 98–111)
Creatinine, Ser: 0.77 mg/dL (ref 0.44–1.00)
GFR, Estimated: >60 mL/min (ref >60)
Glucose, Bld: 71 mg/dL (ref 70–99)
Potassium: 4.3 mmol/L (ref 3.5–5.1)
Sodium: 134 mmol/L — ABNORMAL LOW (ref 135–145)
Total Bilirubin: 0.7 mg/dL (ref 0.0–1.2)
Total Protein: 8.6 g/dL — ABNORMAL HIGH (ref 6.5–8.1)

## 2023-09-19 LAB — RAPID URINE DRUG SCREEN, HOSP PERFORMED
Amphetamines: POSITIVE — AB
Barbiturates: NOT DETECTED
Benzodiazepines: NOT DETECTED
Cocaine: POSITIVE — AB
Opiates: NOT DETECTED
Tetrahydrocannabinol: POSITIVE — AB

## 2023-09-19 LAB — RESP PANEL BY RT-PCR (RSV, FLU A&B, COVID)  RVPGX2
Influenza A by PCR: NEGATIVE
Influenza B by PCR: NEGATIVE
Resp Syncytial Virus by PCR: NEGATIVE
SARS Coronavirus 2 by RT PCR: NEGATIVE

## 2023-09-19 LAB — HCG, SERUM, QUALITATIVE: Preg, Serum: NEGATIVE

## 2023-09-19 LAB — ETHANOL: Alcohol, Ethyl (B): 238 mg/dL — ABNORMAL HIGH (ref <15)

## 2023-09-19 MED ORDER — ACETAMINOPHEN 325 MG PO TABS
650.0000 mg | ORAL_TABLET | Freq: Once | ORAL | Status: AC
Start: 2023-09-19 — End: 2023-09-19
  Administered 2023-09-19: 650 mg via ORAL
  Filled 2023-09-19: qty 2

## 2023-09-19 MED ORDER — CIPROFLOXACIN-DEXAMETHASONE 0.3-0.1 % OT SUSP
4.0000 [drp] | Freq: Once | OTIC | Status: AC
  Administered 2023-09-19: 4 [drp] via OTIC
  Filled 2023-09-19: qty 7.5

## 2023-09-19 NOTE — Discharge Instructions (Signed)
 Use the ciprofloxacin -dexamethasone  eardrops 4 drops in the left ear twice per day for the next 7 days.  Call the ENT office today to set up an appointment later this week.  Do not let water get in your left ear.  This includes while bathing, do not swim, or other activities that would get water in your ear.  If you develop new or worsening headache, vomiting, numbness or weakness, or any other new/concerning symptoms then return to the ER.

## 2023-09-19 NOTE — ED Triage Notes (Signed)
 Pt BIBA with c/o ETOH intoxication. Pt unable to answer questions at this time. Denies any drug use. Vitals WDL.   128/84 100% HR 98 RR 20 CBG 102

## 2023-09-19 NOTE — ED Notes (Signed)
 Patient transported to CT
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
## 2023-09-23 ENCOUNTER — Emergency Department (HOSPITAL_COMMUNITY)
Admission: EM | Admit: 2023-09-23 | Discharge: 2023-09-24 | Disposition: A | Discharge status: Home / Self Care | Payer: MEDICAID | Attending: Emergency Medicine | Admitting: Emergency Medicine

## 2023-09-23 ENCOUNTER — Other Ambulatory Visit: Payer: Self-pay

## 2023-09-23 ENCOUNTER — Encounter (HOSPITAL_COMMUNITY): Payer: Self-pay | Admitting: Emergency Medicine

## 2023-09-23 DIAGNOSIS — H7292 Unspecified perforation of tympanic membrane, left ear: Secondary | ICD-10-CM | POA: Insufficient documentation

## 2023-09-23 DIAGNOSIS — H9202 Otalgia, left ear: Secondary | ICD-10-CM | POA: Diagnosis present

## 2023-09-23 MED ORDER — OXYCODONE HCL 5 MG PO TABS
5.0000 mg | ORAL_TABLET | Freq: Once | ORAL | Status: AC
  Administered 2023-09-24: 5 mg via ORAL
  Filled 2023-09-23: qty 1

## 2023-09-23 MED ORDER — MECLIZINE HCL 25 MG PO TABS
25.0000 mg | ORAL_TABLET | Freq: Once | ORAL | Status: AC
  Administered 2023-09-24: 25 mg via ORAL
  Filled 2023-09-23: qty 1

## 2023-09-23 NOTE — ED Triage Notes (Signed)
  Patient BIB EMS for L ear pain that has been going on for 3-4 days.  Patient states she was seen for same and given ear drops but states they are not helping.  ETOH on board.  Pain 7/10, throbbing.

## 2023-09-24 MED ORDER — MECLIZINE HCL 25 MG PO TABS: 25.0000 mg | ORAL_TABLET | Freq: Three times a day (TID) | ORAL | 0 refills | Status: DC | PRN

## 2023-09-24 NOTE — ED Notes (Signed)
 Pt ambulated in ED independently
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
## 2023-10-14 ENCOUNTER — Other Ambulatory Visit: Payer: Self-pay

## 2023-10-14 ENCOUNTER — Emergency Department (HOSPITAL_COMMUNITY)
Admission: EM | Admit: 2023-10-14 | Discharge: 2023-10-14 | Disposition: A | Discharge status: Home / Self Care | Payer: MEDICAID | Attending: Emergency Medicine | Admitting: Emergency Medicine

## 2023-10-14 ENCOUNTER — Encounter (HOSPITAL_COMMUNITY): Payer: Self-pay

## 2023-10-14 DIAGNOSIS — T781XXA Other adverse food reactions, not elsewhere classified, initial encounter: Secondary | ICD-10-CM | POA: Diagnosis not present

## 2023-10-14 DIAGNOSIS — R070 Pain in throat: Secondary | ICD-10-CM | POA: Diagnosis present

## 2023-10-14 DIAGNOSIS — T7840XA Allergy, unspecified, initial encounter: Secondary | ICD-10-CM

## 2023-10-14 HISTORY — DX: Alcohol dependence, uncomplicated: F10.20

## 2023-10-14 MED ORDER — EPINEPHRINE 0.3 MG/0.3ML IJ SOAJ: 0.3000 mg | INTRAMUSCULAR | 0 refills | Status: AC | PRN

## 2023-10-14 NOTE — ED Provider Notes (Signed)
 [place] EMERGENCY DEPARTMENT AT [facility] Provider Note   CSN: [id number] Arrival date & time: 10/14/23  6578     History  Chief Complaint  Patient presents with   Allergic Reaction    Doris Gonzalez is a 38 y.o. female.  With a history of shellfish allergies who presents to ED after a suspect allergic reaction.  Patient reports around 1800 tonight she had a sandwich that she thought was [place] but may have contained shellfish.  She had swelling of her lips scratchy throat was itchy.  Upon EMS arrival her voice was raspy.  They gave Benadryl and IM epi.  Patient is feeling as though her allergic symptoms resolved.  No nausea or vomiting or shortness of breath currently.  sHe does report alcohol use today.  Is scheduled to start alcohol rehab tomorrow at an outpatient facility.   Allergic Reaction      Home Medications Prior to Admission medications   Medication Sig Start Date End Date Taking? Authorizing Provider  EPINEPHrine  0.3 mg/0.3 mL IJ SOAJ injection Inject 0.3 mg into the muscle as needed for anaphylaxis. 10/14/23  Yes Rafael Bun A, DO  acetaminophen  (TYLENOL ) 500 MG tablet Take 1 tablet (500 mg total) by mouth every 6 (six) hours as needed. 09/29/21   Starlene Eaton, FNP  ibuprofen  (ADVIL ) 600 MG tablet Take 1 tablet (600 mg total) by mouth every 6 (six) hours as needed. 09/29/21   Starlene Eaton, FNP  loperamide  (IMODIUM ) 2 MG capsule Take 1 capsule (2 mg total) by mouth 4 (four) times daily as needed for diarrhea or loose stools. 05/04/21   White, Maybelle Spatz, NP  meclizine  (ANTIVERT ) 25 MG tablet Take 1 tablet (25 mg total) by mouth 3 (three) times daily as needed for dizziness. 09/24/23   Kelsey Patricia, MD  metroNIDAZOLE  (FLAGYL ) 500 MG tablet Take 1 tablet (500 mg total) by mouth 2 (two) times daily. 10/01/21   Lamptey, Donley Furth, MD  naproxen  (NAPROSYN ) 500 MG tablet Take 1 tablet (500 mg total) by mouth 2 (two) times daily. 05/04/21   White,  Maybelle Spatz, NP  ondansetron  (ZOFRAN ) 4 MG tablet Take 1 tablet (4 mg total) by mouth every 6 (six) hours. 05/04/21   White, Maybelle Spatz, NP  oxyCODONE  (ROXICODONE ) 5 MG immediate release tablet Take 0.5-1 tablets (2.5-5 mg total) by mouth every 4 (four) hours as needed for severe pain. 09/13/21   Harris, Abigail, PA-C      Allergies    Shellfish allergy and Shellfish-derived products    Review of Systems   Review of Systems  Physical Exam Updated Vital Signs BP 108/60   Pulse (!) 108   Temp 98.4 F (36.9 C)   Resp 16   Ht 5' (1.524 m)   Wt 59 kg   SpO2 100%   BMI 25.39 kg/m  Physical Exam Vitals and nursing note reviewed.  HENT:     Head: Normocephalic and atraumatic.  Eyes:     Pupils: Pupils are equal, round, and reactive to light.  Cardiovascular:     Rate and Rhythm: Normal rate and regular rhythm.  Pulmonary:     Effort: Pulmonary effort is normal.     Breath sounds: Normal breath sounds.  Abdominal:     Palpations: Abdomen is soft.     Tenderness: There is no abdominal tenderness.  Skin:    General: Skin is warm and dry.  Neurological:     Mental Status: She is alert.  Psychiatric:  Mood and Affect: Mood normal.     ED Results / Procedures / Treatments   Labs (all labs ordered are listed, but only abnormal results are displayed) Labs Reviewed - No data to display  EKG None  Radiology No results found.  Procedures Procedures    Medications Ordered in ED Medications - No data to display  ED Course/ Medical Decision Making/ A&P Clinical Course as of 10/14/23 2241  Sat Oct 14, 2023  2240 No recurrence of allergic symptoms.  Will discharge with prescription for EpiPen  called into her pharmacy and took for PCP follow-up. [MP]    Clinical Course User Index [MP] Sallyanne Creamer, DO                                 Medical Decision Making 38 year old female with history as above presenting after suspected allergic reaction.  Exposure to  shellfish which she has a documented allergy to.  Received epinephrine  IM and Benadryl p.o. from EMS.  Upon my initial assessment no concern for airway compromise.  She appears comfortable with no other symptoms of allergic reaction now.  Will monitor for period of 4 hours to ascertain need for another epinephrine .  Risk Prescription drug management.           Final Clinical Impression(s) / ED Diagnoses Final diagnoses:  Allergic reaction, initial encounter    Rx / DC Orders ED Discharge Orders          Ordered    EPINEPHrine  0.3 mg/0.3 mL IJ SOAJ injection  As needed        10/14/23 2226              Sallyanne Creamer, DO 10/14/23 2241

## 2023-10-14 NOTE — Discharge Instructions (Addendum)
 You were seen in the ER after an allergic reaction EMS gave you epinephrine  (epipen ) and benadryl You did not need another dose of epi here We have called in a prescription for an epipen  to your pharmacy to use if you have another allergic reaction Return to the er or call 911 for another allergic reaction Follouw up with your pcpc within a week for reevaluation

## 2023-10-14 NOTE — ED Triage Notes (Signed)
 Pt brought in by EMS from home. Ate shellfish accidentally and has an allergy. Voice raspy upon EMS arrival and very anxious. 25 mg benadryl PO given by fire department and epinephrine  given IM. ETOH on board. Pt states she doesn't want to be here and she didn't need a shot. Pt states she is supposed to go to rehab for alcoholism tomorrow.

## 2023-11-29 ENCOUNTER — Other Ambulatory Visit: Payer: Self-pay

## 2023-11-29 ENCOUNTER — Emergency Department (HOSPITAL_COMMUNITY): Payer: MEDICAID

## 2023-11-29 ENCOUNTER — Encounter (HOSPITAL_COMMUNITY): Payer: Self-pay

## 2023-11-29 ENCOUNTER — Emergency Department (HOSPITAL_COMMUNITY)
Admission: EM | Admit: 2023-11-29 | Discharge: 2023-11-30 | Disposition: A | Discharge status: Home / Self Care | Payer: MEDICAID | Attending: Emergency Medicine | Admitting: Emergency Medicine

## 2023-11-29 DIAGNOSIS — Y908 Blood alcohol level of 240 mg/100 ml or more: Secondary | ICD-10-CM | POA: Insufficient documentation

## 2023-11-29 DIAGNOSIS — F10120 Alcohol abuse with intoxication, uncomplicated: Secondary | ICD-10-CM | POA: Insufficient documentation

## 2023-11-29 DIAGNOSIS — F10920 Alcohol use, unspecified with intoxication, uncomplicated: Secondary | ICD-10-CM

## 2023-11-29 LAB — COMPREHENSIVE METABOLIC PANEL WITH GFR
ALT: 45 U/L — ABNORMAL HIGH (ref 0–44)
AST: 99 U/L — ABNORMAL HIGH (ref 15–41)
Albumin: 3.8 g/dL (ref 3.5–5.0)
Alkaline Phosphatase: 43 U/L (ref 38–126)
Anion gap: 10 (ref 5–15)
BUN: 9 mg/dL (ref 6–20)
CO2: 22 mmol/L (ref 22–32)
Calcium: 8.4 mg/dL — ABNORMAL LOW (ref 8.9–10.3)
Chloride: 108 mmol/L (ref 98–111)
Creatinine, Ser: 0.98 mg/dL (ref 0.44–1.00)
GFR, Estimated: >60 mL/min (ref >60)
Glucose, Bld: 119 mg/dL — ABNORMAL HIGH (ref 70–99)
Potassium: 4.4 mmol/L (ref 3.5–5.1)
Sodium: 140 mmol/L (ref 135–145)
Total Bilirubin: 0.9 mg/dL (ref 0.0–1.2)
Total Protein: 7.6 g/dL (ref 6.5–8.1)

## 2023-11-29 LAB — CBC WITH DIFFERENTIAL/PLATELET
Abs Immature Granulocytes: 0.02 10*3/uL (ref 0.00–0.07)
Basophils Absolute: 0.1 10*3/uL (ref 0.0–0.1)
Basophils Relative: 1 %
Eosinophils Absolute: 0.1 10*3/uL (ref 0.0–0.5)
Eosinophils Relative: 1 %
HCT: 33.8 % — ABNORMAL LOW (ref 36.0–46.0)
Hemoglobin: 11.6 g/dL — ABNORMAL LOW (ref 12.0–15.0)
Immature Granulocytes: 0 %
Lymphocytes Relative: 34 %
Lymphs Abs: 2.4 10*3/uL (ref 0.7–4.0)
MCH: 30.9 pg (ref 26.0–34.0)
MCHC: 34.3 g/dL (ref 30.0–36.0)
MCV: 90.1 fL (ref 80.0–100.0)
Monocytes Absolute: 0.6 10*3/uL (ref 0.1–1.0)
Monocytes Relative: 8 %
Neutro Abs: 4 10*3/uL (ref 1.7–7.7)
Neutrophils Relative %: 56 %
Platelets: 241 10*3/uL (ref 150–400)
RBC: 3.75 MIL/uL — ABNORMAL LOW (ref 3.87–5.11)
RDW: 12.3 % (ref 11.5–15.5)
WBC: 7 10*3/uL (ref 4.0–10.5)
nRBC: 0 % (ref 0.0–0.2)

## 2023-11-29 LAB — I-STAT CHEM 8, ED
BUN: 12 mg/dL (ref 6–20)
Calcium, Ion: 0.98 mmol/L — ABNORMAL LOW (ref 1.15–1.40)
Chloride: 110 mmol/L (ref 98–111)
Creatinine, Ser: 1.4 mg/dL — ABNORMAL HIGH (ref 0.44–1.00)
Glucose, Bld: 115 mg/dL — ABNORMAL HIGH (ref 70–99)
HCT: 36 % (ref 36.0–46.0)
Hemoglobin: 12.2 g/dL (ref 12.0–15.0)
Potassium: 4.4 mmol/L (ref 3.5–5.1)
Sodium: 141 mmol/L (ref 135–145)
TCO2: 23 mmol/L (ref 22–32)

## 2023-11-29 LAB — HCG, SERUM, QUALITATIVE: Preg, Serum: NEGATIVE

## 2023-11-29 LAB — ETHANOL: Alcohol, Ethyl (B): 421 mg/dL (ref <15)

## 2023-11-29 MED ORDER — LORAZEPAM 2 MG/ML IJ SOLN: 0.5000 mg | Freq: Once | INTRAMUSCULAR | Status: AC

## 2023-11-29 MED ORDER — LORAZEPAM 2 MG/ML IJ SOLN
INTRAMUSCULAR | Status: AC
  Administered 2023-11-29: 0.5 mg via INTRAVENOUS
  Filled 2023-11-29: qty 1

## 2023-11-29 NOTE — ED Provider Notes (Signed)
 [place] [facility] DE[facility]MENT AT [facility] Provider Note   CSN: [id number] Arrival date & time: 11/29/23  8161     Patient presents with: Drug Overdose   Doris Gonzalez is a 38 y.o. female.  {Add pertinent medical, surgical, social history, OB history to HPI:622} 38 year old female with prior medical history as detailed below presents for evaluation.  Patient was found on the roadside.  She was initially unresponsive.  Patient was given 1 mg of Narcan  intranasally.  Subsequently her mental status improved.  On arrival she is awake but incoherent.  She reports alcohol and cocaine use.  It is unclear whether she use other substances as well.  It is difficult to direct her, but she is not particularly aggressive with her agitation.  The history is provided by the patient, the EMS personnel and medical records.       Prior to Admission medications   Medication Sig Start Date End Date Taking? Authorizing Provider  acetaminophen  (TYLENOL ) 500 MG tablet Take 1 tablet (500 mg total) by mouth every 6 (six) hours as needed. 09/29/21   Enedelia Dorna HERO, FNP  EPINEPHrine  0.3 mg/0.3 mL IJ SOAJ injection Inject 0.3 mg into the muscle as needed for anaphylaxis. 10/14/23   Pamella Ozell LABOR, DO  ibuprofen  (ADVIL ) 600 MG tablet Take 1 tablet (600 mg total) by mouth every 6 (six) hours as needed. 09/29/21   Enedelia Dorna HERO, FNP  loperamide  (IMODIUM ) 2 MG capsule Take 1 capsule (2 mg total) by mouth 4 (four) times daily as needed for diarrhea or loose stools. 05/04/21   White, Shelba SAUNDERS, NP  meclizine  (ANTIVERT ) 25 MG tablet Take 1 tablet (25 mg total) by mouth 3 (three) times daily as needed for dizziness. 09/24/23   Griselda Norris, MD  metroNIDAZOLE  (FLAGYL ) 500 MG tablet Take 1 tablet (500 mg total) by mouth 2 (two) times daily. 10/01/21   Lamptey, Aleene KIDD, MD  naproxen  (NAPROSYN ) 500 MG tablet Take 1 tablet (500 mg total) by mouth 2 (two) times daily. 05/04/21   White, Shelba SAUNDERS, NP   ondansetron  (ZOFRAN ) 4 MG tablet Take 1 tablet (4 mg total) by mouth every 6 (six) hours. 05/04/21   White, Shelba SAUNDERS, NP  oxyCODONE  (ROXICODONE ) 5 MG immediate release tablet Take 0.5-1 tablets (2.5-5 mg total) by mouth every 4 (four) hours as needed for severe pain. 09/13/21   Harris, Abigail, PA-C    Allergies: Shellfish allergy and Shellfish-derived products    Review of Systems  Unable to perform ROS: Acuity of condition    Updated Vital Signs BP 112/77   Pulse 88   Temp 97.9 F (36.6 C) (Oral)   Resp 18   SpO2 100%   Physical Exam Vitals and nursing note reviewed.  Constitutional:      General: She is not in acute distress.    Appearance: She is well-developed.     Comments: Alert but disheveled, agitated but not aggressive, admits to EtOH use  HENT:     Head: Normocephalic and atraumatic.  Eyes:     Conjunctiva/sclera: Conjunctivae normal.     Pupils: Pupils are equal, round, and reactive to light.  Cardiovascular:     Rate and Rhythm: Normal rate and regular rhythm.     Heart sounds: Normal heart sounds.  Pulmonary:     Effort: Pulmonary effort is normal. No respiratory distress.     Breath sounds: Normal breath sounds.  Abdominal:     General: There is no distension.  Palpations: Abdomen is soft.     Tenderness: There is no abdominal tenderness.  Musculoskeletal:        General: No deformity. Normal range of motion.     Cervical back: Normal range of motion and neck supple.  Skin:    General: Skin is warm and dry.  Neurological:     General: No focal deficit present.     Mental Status: She is alert.     (all labs ordered are listed, but only abnormal results are displayed) Labs Reviewed  COMPREHENSIVE METABOLIC PANEL WITH GFR - Abnormal; Notable for the following components:      Result Value   Glucose, Bld 119 (*)    Calcium 8.4 (*)    AST 99 (*)    ALT 45 (*)    All other components within normal limits  CBC WITH DIFFERENTIAL/PLATELET -  Abnormal; Notable for the following components:   RBC 3.75 (*)    Hemoglobin 11.6 (*)    HCT 33.8 (*)    All other components within normal limits  I-STAT CHEM 8, ED - Abnormal; Notable for the following components:   Creatinine, Ser 1.40 (*)    Glucose, Bld 115 (*)    Calcium, Ion 0.98 (*)    All other components within normal limits  HCG, SERUM, QUALITATIVE  ETHANOL  RAPID URINE DRUG SCREEN, HOSP PERFORMED  CBG MONITORING, ED    EKG: EKG Interpretation Date/Time:  Wednesday November 29 2023 18:48:12 EDT Ventricular Rate:  103 PR Interval:  140 QRS Duration:  84 QT Interval:  390 QTC Calculation: 511 R Axis:   83  Text Interpretation: Sinus tachycardia Right atrial enlargement Prolonged QT interval Confirmed by Laurice Coy [phone]) on 11/29/2023 6:52:01 PM  Radiology: No results found.  {Document cardiac monitor, telemetry assessment procedure when appropriate:32947} Procedures   Medications Ordered in the ED  LORazepam  (ATIVAN ) injection 0.5 mg (0.5 mg Intravenous Given 11/29/23 1901)      {Click here for ABCD2, HEART and other calculators REFRESH Note before signing:1}                              Medical Decision Making Amount and/or Complexity of Data Reviewed Labs: ordered. Radiology: ordered.  Risk Prescription drug management.    Medical Screen Complete  This patient presented to the ED with complaint of AMS, suspected polysubstance abuse.  This complaint involves an extensive number of treatment options. The initial differential diagnosis includes, but is not limited to, polysubstance abuse, EtOH intoxication, metabolic abnormality, etc.  This presentation is: Acute, Chronic, Self-Limited, Previously Undiagnosed, Uncertain Prognosis, Complicated, Systemic Symptoms, and Threat to Life/Bodily Function  Patient presents with likely EtOH intoxication and or polysubstance abuse.  Patient is unable to be safely discharged.  She is agitated and difficult to  direct.  She is clinically intoxicated on initial evaluation.  With administration of Ativan  the patient is improved.  Workup demonstrates elevated alcohol level with an EtOH of 421.  Patient will require observation in ED until she is appropriate for discharge.  Oncoming ED provider aware of case.   Co morbidities that complicated the patient's evaluation  ***   Additional history obtained:  Additional history obtained from {History source:19196::EMS,Spouse,Family,Friend,Caregiver} External records from outside sources obtained and reviewed including prior ED visits and prior Inpatient records.    Lab Tests:  I ordered and personally interpreted labs.  The pertinent results include:  ***   Imaging Studies  ordered:  I ordered imaging studies including ***  I independently visualized and interpreted obtained imaging which showed *** I agree with the radiologist interpretation.   Cardiac Monitoring:  The patient was maintained on a cardiac monitor.  I personally viewed and interpreted the cardiac monitor which showed an underlying rhythm of: ***   Medicines ordered:  I ordered medication including ***  for ***  Reevaluation of the patient after these medicines showed that the patient: {resolved/improved/worsened:23923::improved}    Test Considered:  ***   Critical Interventions:  ***   Consultations Obtained:  I consulted ***,  and discussed lab and imaging findings as well as pertinent plan of care.    Problem List / ED Course:  ***   Reevaluation:  After the interventions noted above, I reevaluated the patient and found that they have: {resolved/improved/worsened:23923::improved}   Social Determinants of Health:  ***   Disposition:  After consideration of the diagnostic results and the patients response to treatment, I feel that the patent would benefit from ***.    {Document critical care time when appropriate  Document  review of labs and clinical decision tools ie CHADS2VASC2, etc  Document your independent review of radiology images and any outside records  Document your discussion with family members, caretakers and with consultants  Document social determinants of health affecting pt's care  Document your decision making why or why not admission, treatments were needed:32947:::1}   Final diagnoses:  None    ED Discharge Orders     None

## 2023-11-29 NOTE — ED Notes (Signed)
 PT thrashing around in bed, crying and unable to understand words pt is saying. EDP at bedside and gave order to administer 1mg  ativan . Unable to complete CIWA at this time due to pt agitation.

## 2023-11-29 NOTE — ED Notes (Signed)
 PT is resting at this time lights dimmed, call bell in reach. Pt is on monitor. Even respirations. Fall pads placed on floor as precaution.

## 2023-11-29 NOTE — ED Triage Notes (Signed)
 Pt BIB [facility], found on curb, unconscious, snoring RR, pinpoint pupils, fire department gave 1mg  narcan  IN,  altered LOC post medication. Hx of ETOH.  VS 122/84 Hr 106 RR CBG 127 RR16 O2 90%, then placed on non-rebreather, brought up to 100% after.

## 2023-11-30 NOTE — ED Notes (Addendum)
 Pt ambulated to restroom and back. Sandwich and drinks given. Pt tolerated PO. Pt alert and oriented.

## 2023-11-30 NOTE — ED Provider Notes (Signed)
 I assumed care at signout. Plan was to monitor patient till she wakes up. Patient is now awake and alert and she can ambulate.  She is taking oral fluids She has bruising to her right lower extremity but a negative x-ray.  No signs of head trauma. No difficulty breathing or difficulty swallowing noted Patient safe for discharge   Midge Golas, MD 11/30/23 [phone]

## 2023-11-30 NOTE — ED Notes (Signed)
 Pt aroused and drink given

## 2023-12-05 ENCOUNTER — Other Ambulatory Visit: Payer: Self-pay

## 2023-12-05 ENCOUNTER — Ambulatory Visit (HOSPITAL_COMMUNITY)
Admission: EM | Admit: 2023-12-05 | Discharge: 2023-12-05 | Disposition: A | Discharge status: Other Acute Inpatient Hospital | Payer: MEDICAID | Attending: Psychiatry | Admitting: Psychiatry

## 2023-12-05 ENCOUNTER — Other Ambulatory Visit (INDEPENDENT_AMBULATORY_CARE_PROVIDER_SITE_OTHER)
Admission: EM | Admit: 2023-12-05 | Discharge: 2023-12-11 | Disposition: A | Discharge status: Home / Self Care | Payer: MEDICAID | Source: Intra-hospital | Attending: Psychiatry | Admitting: Psychiatry

## 2023-12-05 DIAGNOSIS — Z79899 Other long term (current) drug therapy: Secondary | ICD-10-CM | POA: Insufficient documentation

## 2023-12-05 DIAGNOSIS — Z91419 Personal history of unspecified adult abuse: Secondary | ICD-10-CM | POA: Insufficient documentation

## 2023-12-05 DIAGNOSIS — Z555 Less than a high school diploma: Secondary | ICD-10-CM | POA: Insufficient documentation

## 2023-12-05 DIAGNOSIS — I517 Cardiomegaly: Secondary | ICD-10-CM | POA: Insufficient documentation

## 2023-12-05 DIAGNOSIS — R45851 Suicidal ideations: Secondary | ICD-10-CM | POA: Insufficient documentation

## 2023-12-05 DIAGNOSIS — Z9151 Personal history of suicidal behavior: Secondary | ICD-10-CM | POA: Insufficient documentation

## 2023-12-05 DIAGNOSIS — A749 Chlamydial infection, unspecified: Secondary | ICD-10-CM | POA: Insufficient documentation

## 2023-12-05 DIAGNOSIS — Z91148 Patient's other noncompliance with medication regimen for other reason: Secondary | ICD-10-CM | POA: Insufficient documentation

## 2023-12-05 DIAGNOSIS — R9431 Abnormal electrocardiogram [ECG] [EKG]: Secondary | ICD-10-CM | POA: Insufficient documentation

## 2023-12-05 DIAGNOSIS — Z608 Other problems related to social environment: Secondary | ICD-10-CM | POA: Insufficient documentation

## 2023-12-05 DIAGNOSIS — Z5900 Homelessness unspecified: Secondary | ICD-10-CM | POA: Insufficient documentation

## 2023-12-05 DIAGNOSIS — D649 Anemia, unspecified: Secondary | ICD-10-CM | POA: Insufficient documentation

## 2023-12-05 DIAGNOSIS — F3181 Bipolar II disorder: Secondary | ICD-10-CM

## 2023-12-05 DIAGNOSIS — F319 Bipolar disorder, unspecified: Secondary | ICD-10-CM | POA: Diagnosis not present

## 2023-12-05 DIAGNOSIS — R300 Dysuria: Secondary | ICD-10-CM | POA: Insufficient documentation

## 2023-12-05 DIAGNOSIS — F3130 Bipolar disorder, current episode depressed, mild or moderate severity, unspecified: Secondary | ICD-10-CM | POA: Insufficient documentation

## 2023-12-05 DIAGNOSIS — F1414 Cocaine abuse with cocaine-induced mood disorder: Secondary | ICD-10-CM | POA: Insufficient documentation

## 2023-12-05 DIAGNOSIS — F1423 Cocaine dependence with withdrawal: Secondary | ICD-10-CM | POA: Insufficient documentation

## 2023-12-05 DIAGNOSIS — F1020 Alcohol dependence, uncomplicated: Secondary | ICD-10-CM | POA: Insufficient documentation

## 2023-12-05 DIAGNOSIS — F10230 Alcohol dependence with withdrawal, uncomplicated: Secondary | ICD-10-CM | POA: Insufficient documentation

## 2023-12-05 DIAGNOSIS — F3113 Bipolar disorder, current episode manic without psychotic features, severe: Secondary | ICD-10-CM

## 2023-12-05 DIAGNOSIS — F209 Schizophrenia, unspecified: Secondary | ICD-10-CM | POA: Diagnosis not present

## 2023-12-05 DIAGNOSIS — F1094 Alcohol use, unspecified with alcohol-induced mood disorder: Secondary | ICD-10-CM

## 2023-12-05 DIAGNOSIS — F1024 Alcohol dependence with alcohol-induced mood disorder: Secondary | ICD-10-CM | POA: Diagnosis present

## 2023-12-05 LAB — LIPID PANEL
Cholesterol: 271 mg/dL — ABNORMAL HIGH (ref 0–200)
HDL: >135 mg/dL (ref >40)
Triglycerides: 42 mg/dL (ref <150)
VLDL: 8 mg/dL (ref 0–40)

## 2023-12-05 LAB — CBC WITH DIFFERENTIAL/PLATELET
Abs Immature Granulocytes: 0.01 K/uL (ref 0.00–0.07)
Basophils Absolute: 0 K/uL (ref 0.0–0.1)
Basophils Relative: 1 %
Eosinophils Absolute: 0.1 K/uL (ref 0.0–0.5)
Eosinophils Relative: 2 %
HCT: 39.3 % (ref 36.0–46.0)
Hemoglobin: 13.5 g/dL (ref 12.0–15.0)
Immature Granulocytes: 0 %
Lymphocytes Relative: 47 %
Lymphs Abs: 2 K/uL (ref 0.7–4.0)
MCH: 31.1 pg (ref 26.0–34.0)
MCHC: 34.4 g/dL (ref 30.0–36.0)
MCV: 90.6 fL (ref 80.0–100.0)
Monocytes Absolute: 0.4 K/uL (ref 0.1–1.0)
Monocytes Relative: 9 %
Neutro Abs: 1.8 K/uL (ref 1.7–7.7)
Neutrophils Relative %: 41 %
Platelets: 281 K/uL (ref 150–400)
RBC: 4.34 MIL/uL (ref 3.87–5.11)
RDW: 12.6 % (ref 11.5–15.5)
WBC: 4.3 K/uL (ref 4.0–10.5)
nRBC: 0 % (ref 0.0–0.2)

## 2023-12-05 LAB — HEMOGLOBIN A1C
Hgb A1c MFr Bld: 5.1 % (ref 4.8–5.6)
Mean Plasma Glucose: 99.67 mg/dL

## 2023-12-05 LAB — TSH: TSH: 1.957 u[IU]/mL (ref 0.350–4.500)

## 2023-12-05 LAB — COMPREHENSIVE METABOLIC PANEL WITH GFR
ALT: 47 U/L — ABNORMAL HIGH (ref 0–44)
AST: 62 U/L — ABNORMAL HIGH (ref 15–41)
Albumin: 4 g/dL (ref 3.5–5.0)
Alkaline Phosphatase: 54 U/L (ref 38–126)
Anion gap: 12 (ref 5–15)
BUN: 14 mg/dL (ref 6–20)
CO2: 25 mmol/L (ref 22–32)
Calcium: 9.4 mg/dL (ref 8.9–10.3)
Chloride: 97 mmol/L — ABNORMAL LOW (ref 98–111)
Creatinine, Ser: 0.8 mg/dL (ref 0.44–1.00)
GFR, Estimated: >60 mL/min (ref >60)
Glucose, Bld: 84 mg/dL (ref 70–99)
Potassium: 4.6 mmol/L (ref 3.5–5.1)
Sodium: 134 mmol/L — ABNORMAL LOW (ref 135–145)
Total Bilirubin: 1.7 mg/dL — ABNORMAL HIGH (ref 0.0–1.2)
Total Protein: 8.3 g/dL — ABNORMAL HIGH (ref 6.5–8.1)

## 2023-12-05 LAB — VALPROIC ACID LEVEL: Valproic Acid Lvl: <10 ug/mL — ABNORMAL LOW (ref 50–100)

## 2023-12-05 LAB — ETHANOL: Alcohol, Ethyl (B): <15 mg/dL (ref <15)

## 2023-12-05 LAB — MAGNESIUM: Magnesium: 1.6 mg/dL — ABNORMAL LOW (ref 1.7–2.4)

## 2023-12-05 MED ORDER — LORAZEPAM 1 MG PO TABS
1.0000 mg | ORAL_TABLET | Freq: Two times a day (BID) | ORAL | Status: AC
  Administered 2023-12-08 (×2): 1 mg via ORAL
  Filled 2023-12-05 (×2): qty 1

## 2023-12-05 MED ORDER — DIVALPROEX SODIUM 500 MG PO DR TAB: 500.0000 mg | DELAYED_RELEASE_TABLET | Freq: Two times a day (BID) | ORAL | Status: DC

## 2023-12-05 MED ORDER — DIPHENHYDRAMINE HCL 50 MG/ML IJ SOLN: 50.0000 mg | Freq: Three times a day (TID) | INTRAMUSCULAR | Status: DC | PRN

## 2023-12-05 MED ORDER — LORAZEPAM 2 MG/ML IJ SOLN: 2.0000 mg | Freq: Three times a day (TID) | INTRAMUSCULAR | Status: DC | PRN

## 2023-12-05 MED ORDER — DIPHENHYDRAMINE HCL 50 MG PO CAPS: 50.0000 mg | ORAL_CAPSULE | Freq: Three times a day (TID) | ORAL | Status: DC | PRN

## 2023-12-05 MED ORDER — LORAZEPAM 1 MG PO TABS
1.0000 mg | ORAL_TABLET | Freq: Four times a day (QID) | ORAL | Status: AC
  Administered 2023-12-05 – 2023-12-06 (×6): 1 mg via ORAL
  Filled 2023-12-05 (×6): qty 1

## 2023-12-05 MED ORDER — ADULT MULTIVITAMIN W/MINERALS CH
1.0000 | ORAL_TABLET | Freq: Every day | ORAL | Status: DC
  Administered 2023-12-05 – 2023-12-07 (×3): 1 via ORAL
  Filled 2023-12-05 (×3): qty 1

## 2023-12-05 MED ORDER — QUETIAPINE FUMARATE 50 MG PO TABS
50.0000 mg | ORAL_TABLET | Freq: Every day | ORAL | Status: DC
  Administered 2023-12-05 – 2023-12-10 (×6): 50 mg via ORAL
  Filled 2023-12-05 (×6): qty 1

## 2023-12-05 MED ORDER — EPINEPHRINE 0.3 MG/0.3ML IJ SOAJ: 0.3000 mg | INTRAMUSCULAR | Status: DC | PRN

## 2023-12-05 MED ORDER — HALOPERIDOL LACTATE 5 MG/ML IJ SOLN: 10.0000 mg | Freq: Three times a day (TID) | INTRAMUSCULAR | Status: DC | PRN

## 2023-12-05 MED ORDER — THIAMINE HCL 100 MG/ML IJ SOLN
100.0000 mg | Freq: Once | INTRAMUSCULAR | Status: AC
  Administered 2023-12-05: 100 mg via INTRAMUSCULAR
  Filled 2023-12-05: qty 2

## 2023-12-05 MED ORDER — LORAZEPAM 1 MG PO TABS
1.0000 mg | ORAL_TABLET | Freq: Three times a day (TID) | ORAL | Status: AC
  Administered 2023-12-07 (×3): 1 mg via ORAL
  Filled 2023-12-05 (×3): qty 1

## 2023-12-05 MED ORDER — DIVALPROEX SODIUM 500 MG PO DR TAB
500.0000 mg | DELAYED_RELEASE_TABLET | Freq: Two times a day (BID) | ORAL | Status: DC
  Administered 2023-12-05 – 2023-12-07 (×4): 500 mg via ORAL
  Filled 2023-12-05 (×4): qty 1

## 2023-12-05 MED ORDER — LOPERAMIDE HCL 2 MG PO CAPS: 2.0000 mg | ORAL_CAPSULE | ORAL | Status: AC | PRN

## 2023-12-05 MED ORDER — HYDROXYZINE HCL 25 MG PO TABS: 25.0000 mg | ORAL_TABLET | Freq: Four times a day (QID) | ORAL | Status: AC | PRN

## 2023-12-05 MED ORDER — LORAZEPAM 1 MG PO TABS
1.0000 mg | ORAL_TABLET | Freq: Every day | ORAL | Status: AC
  Administered 2023-12-09: 1 mg via ORAL
  Filled 2023-12-05: qty 1

## 2023-12-05 MED ORDER — MAGNESIUM HYDROXIDE 400 MG/5ML PO SUSP: 30.0000 mL | Freq: Every day | ORAL | Status: DC | PRN

## 2023-12-05 MED ORDER — NICOTINE 21 MG/24HR TD PT24: 21.0000 mg | MEDICATED_PATCH | Freq: Every day | TRANSDERMAL | Status: DC

## 2023-12-05 MED ORDER — THIAMINE MONONITRATE 100 MG PO TABS
100.0000 mg | ORAL_TABLET | Freq: Every day | ORAL | Status: DC
  Administered 2023-12-06 – 2023-12-11 (×6): 100 mg via ORAL
  Filled 2023-12-05 (×6): qty 1

## 2023-12-05 MED ORDER — MECLIZINE HCL 12.5 MG PO TABS: 25.0000 mg | ORAL_TABLET | Freq: Three times a day (TID) | ORAL | Status: DC | PRN

## 2023-12-05 MED ORDER — HALOPERIDOL LACTATE 5 MG/ML IJ SOLN: 5.0000 mg | Freq: Three times a day (TID) | INTRAMUSCULAR | Status: DC | PRN

## 2023-12-05 MED ORDER — HALOPERIDOL 5 MG PO TABS: 5.0000 mg | ORAL_TABLET | Freq: Three times a day (TID) | ORAL | Status: DC | PRN

## 2023-12-05 MED ORDER — TRAZODONE HCL 50 MG PO TABS
50.0000 mg | ORAL_TABLET | Freq: Every day | ORAL | Status: DC
  Administered 2023-12-05 – 2023-12-10 (×6): 50 mg via ORAL
  Filled 2023-12-05 (×6): qty 1

## 2023-12-05 MED ORDER — ACETAMINOPHEN 325 MG PO TABS
650.0000 mg | ORAL_TABLET | Freq: Four times a day (QID) | ORAL | Status: DC | PRN
  Administered 2023-12-05 – 2023-12-11 (×11): 650 mg via ORAL
  Filled 2023-12-05 (×11): qty 2

## 2023-12-05 MED ORDER — ONDANSETRON 4 MG PO TBDP: 4.0000 mg | ORAL_TABLET | Freq: Four times a day (QID) | ORAL | Status: AC | PRN

## 2023-12-05 MED ORDER — NICOTINE 21 MG/24HR TD PT24
21.0000 mg | MEDICATED_PATCH | Freq: Every day | TRANSDERMAL | Status: DC
  Administered 2023-12-06 – 2023-12-11 (×6): 21 mg via TRANSDERMAL
  Filled 2023-12-05 (×6): qty 1

## 2023-12-05 MED ORDER — LORAZEPAM 1 MG PO TABS
1.0000 mg | ORAL_TABLET | Freq: Four times a day (QID) | ORAL | Status: AC | PRN
  Administered 2023-12-08: 1 mg via ORAL

## 2023-12-05 NOTE — ED Provider Notes (Signed)
 Patient seen briefly and states she was taking Depakote  and Seroquel  but ran out at the beginning of the month. States they were helpful for her mood and she would like to restart. Denies side effects with medications.

## 2023-12-05 NOTE — ED Provider Notes (Signed)
 Behavioral Health Urgent Care Medical Screening Exam  Patient Name: Doris Gonzalez MRN: [id number] Date of Evaluation: 12/05/23 Chief Complaint:  My case worker told me to come here Diagnosis:  Final diagnoses:  Bipolar disorder, current episode manic without psychotic features, severe (HCC)  Alcohol use disorder, moderate, dependence (HCC)  Alcohol use with alcohol-induced mood disorder (HCC)  Schizophrenia, unspecified type (HCC)  Cocaine abuse with cocaine-induced mood disorder (HCC)    History of Present illness:  Doris Gonzalez 38 y.o., female patient presented to [facility] as a voluntary walk in dropped off by peer support worker, with complaints of My case worker told me to come here.   Orlean Brittle, is seen face to face by this provider, consulted with Dr. Corean Potters; and chart reviewed on 12/05/23.  On evaluation Doris Gonzalez reports that she goes to a program called [facility] at [address] [place], [place] [address] and she was told to come here.   Upon further assessment, the patient, though difficult to understand at times, reported experiencing suicidal ideation. She stated that plan would be to take a large number of pills. She reported a prior suicide attempt approximately 8-9 years ago. Currently reports crying spells.  The patient has a history of multiple presentations to the emergency department, with the most recent visit on 7/2 for a drug overdose.  Patient has a psychiatric history of alcohol use disorder, narcotic overdose, bipolar disorder, and schizophrenia. She is prescribed Seroquel  and Depakote  but reports non-adherence to her medication regimen, and does not recall the last time she took them. She endorsed a history of abuse but declined to provide further details.  She reports being homeless, which contributes to poor sleep. However, she stated that she eats adequately and often obtains meals from the [facility]. The patient  completed the 11th grade and has two children, aged 66 and 81. She stated that her adult son is also homeless and sleeps in his vehicle.  She reports drinking alcohol daily when she has the funds, approximately 40 oz per day, with her last use reported as yesterday. She also endorses cocaine use via insufflation but is unable to specify the amount, stating, "I don't know."  During the interview, her speech was pressured and communication was somewhat disorganized. She was difficult to understand at times and provided limited information.  A call was placed to her case manager, Doris Gonzalez, to obtain collateral information, but the call went to voicemail. A follow-up will be conducted.  Collateral information was obtained from Doris Gonzalez, who reported that the patient attended their program earlier today and was endorsing suicidal ideation. During group, the patient reportedly stated, "I don't want to be here anymore" and "I'm really tired." She shared that the patient disclosed these thoughts to her therapist, Dr. Jackalyn @ [phone], and was advised to be brought in by peer support for medication stabilization and detox.  She reported that the patient drinks heavily, although the exact amount is unknown, and that they are currently working on placing the patient at [facility] following psychiatric stabilization and detox. She reported that patient has already completed an interview with [facility], and they are awaiting a response. In the meantime, they are exploring additional facilities.  Doris Gonzalez was informed that inpatient hospitalization for stabilization and detox is typically a few days. She acknowledged this, stating that the patient is homeless and in need of a safe environment to stabilize and "clear her head."  During evaluation [name] is  sitting upright in the chair in no acute distress.  She is alert & oriented x 4, calm, cooperative and attentive for this assessment.  Her mood is  euthymic with congruent affect.  Her speech is rapid,difficult to understand and has normal behavior.  Objectively there is no evidence of psychosis/mania or delusional thinking. Pt does not appear to be responding to internal or external stimuli.  Patient does not appear to be distracted, or pre-occupied.  She reports suicidal ideations with a plan to take a lot of pills. She denies auditory and visual hallucations, psychosis, and paranoia.  Patient answered question appropriately.     Flowsheet Row ED from 12/05/2023 in [facility] ED from 10/14/2023 in [facility] Emergency Department at [facility] ED from 09/23/2023 in [facility] Emergency Department at [facility]  C-SSRS RISK CATEGORY Moderate Risk No Risk No Risk    Psychiatric Specialty Exam  Presentation  General Appearance:Disheveled  Eye Contact:Good  Speech:Pressured  Speech Volume:Decreased  Handedness:Right   Mood and Affect  Mood:Depressed; Hopeless  Affect:Appropriate   Thought Process  Thought Processes:Disorganized  Descriptions of Associations:Intact  Orientation:Full (Time, Place and Person)  Thought Content:WDL    Hallucinations:None  Ideas of Reference:None  Suicidal Thoughts:Yes, Active With Intent; With Plan  Homicidal Thoughts:No   Sensorium  Memory:Immediate Fair; Remote Fair  Judgment:Poor  Insight:Fair   Executive Functions  Concentration:Fair  Attention Span:Fair  Recall:Fair  Fund of Knowledge:Fair  Language:Poor; Fair   Psychomotor Activity  Psychomotor Activity:Normal   Assets  Assets:Desire for Improvement; Social Support   Sleep  Sleep:Poor  Number of hours: No data recorded  Physical Exam: Physical Exam Vitals and nursing note reviewed.  Constitutional:      Appearance: Normal appearance.  HENT:     Head: Normocephalic and atraumatic.     Nose: Nose normal.     Mouth/Throat:     Pharynx: Oropharynx is  clear.  Cardiovascular:     Rate and Rhythm: Regular rhythm. Tachycardia present.  Pulmonary:     Effort: Pulmonary effort is normal.  Musculoskeletal:        General: Normal range of motion.     Cervical back: Normal range of motion.  Skin:    General: Skin is warm.  Neurological:     Mental Status: She is alert and oriented to person, place, and time.  Psychiatric:        Attention and Perception: Attention and perception normal.        Mood and Affect: Mood and affect normal.        Speech: Speech is rapid and pressured.        Behavior: Behavior is cooperative.        Thought Content: Thought content includes suicidal ideation. Thought content includes suicidal plan.        Cognition and Memory: Memory is impaired.        Judgment: Judgment is impulsive.    Review of Systems  Psychiatric/Behavioral:  Positive for substance abuse and suicidal ideas.   All other systems reviewed and are negative.  Blood pressure (!) 131/110, pulse (!) 104, temperature 99.1 F (37.3 C), temperature source Oral, resp. rate 17, SpO2 100%. There is no height or weight on file to calculate BMI.  Musculoskeletal: Strength & Muscle Tone: within normal limits Gait & Station: normal Patient leans: Front   [facility] MSE Discharge Disposition for Follow up and Recommendations: Based on my evaluation I certify that psychiatric inpatient services furnished  can reasonably be expected to improve the patient's condition which I recommend transfer to an appropriate accepting facility.    DISPO Admit to [facility] for mental health stabilization and detox from ETOH and Cocaine.   Basic labs ordered and pending: CBC, CMP, LIPID, HA1C, UDS, Preg Test, EKG, Vit D, Depakote  levels.   Admit to [facility], pending labs.     Tosin Lacey Dotson, NP 12/05/2023, 4:03 PM
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
## 2023-12-05 NOTE — ED Notes (Signed)
Patient transferred to [facility] ?

## 2023-12-05 NOTE — ED Notes (Signed)
 Pt report pain in both shins, which was the result of a fall prior to admission. Pt also report to have chlamydia. Pt denies current SI but says she has had problems with depression. Pt was oriented to unit and provided with dinner. Pt denies questions about discharge paperwork.

## 2023-12-05 NOTE — Group Note (Signed)
 Group Topic: Identity and Relationships  Group Date: 12/05/2023 Start Time: 1715 End Time: 1750 Facilitators: Daved Tinnie HERO, RN  Department: [facility]  Number of Participants: 8  Group Focus: nursing group Treatment Modality:  Psychoeducation Interventions utilized were exploration Purpose: increase insight  Name: Maicee Ullman Date of Birth: 1985-12-23  MR: [id number]    Level of Participation: pt arrived to unit after completion of group. Pt was provided with handouts from group and encouraged to review. Pt will be encouraged to attend future RN education groups.   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

## 2023-12-06 ENCOUNTER — Encounter (HOSPITAL_COMMUNITY): Payer: Self-pay | Admitting: Psychiatry

## 2023-12-06 DIAGNOSIS — F1414 Cocaine abuse with cocaine-induced mood disorder: Secondary | ICD-10-CM | POA: Diagnosis not present

## 2023-12-06 DIAGNOSIS — F10230 Alcohol dependence with withdrawal, uncomplicated: Secondary | ICD-10-CM

## 2023-12-06 DIAGNOSIS — F209 Schizophrenia, unspecified: Secondary | ICD-10-CM | POA: Diagnosis not present

## 2023-12-06 DIAGNOSIS — F1020 Alcohol dependence, uncomplicated: Secondary | ICD-10-CM | POA: Diagnosis not present

## 2023-12-06 DIAGNOSIS — F319 Bipolar disorder, unspecified: Secondary | ICD-10-CM | POA: Diagnosis not present

## 2023-12-06 LAB — POCT URINE DRUG SCREEN - MANUAL ENTRY (I-SCREEN)
POC Amphetamine UR: NOT DETECTED
POC Buprenorphine (BUP): NOT DETECTED
POC Cocaine UR: POSITIVE — AB
POC Marijuana UR: NOT DETECTED
POC Methadone UR: NOT DETECTED
POC Methamphetamine UR: NOT DETECTED
POC Morphine: NOT DETECTED
POC Oxazepam (BZO): POSITIVE — AB
POC Oxycodone UR: NOT DETECTED
POC Secobarbital (BAR): NOT DETECTED
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
## 2023-12-06 NOTE — ED Notes (Signed)
 Pt observed lying in bed. Eyes closed respirations even and non labored. NAD Q 15 minute observations continue for safety.

## 2023-12-06 NOTE — Group Note (Signed)
 Group Topic: Relapse and Recovery  Group Date: 12/06/2023 Start Time: 1215 End Time: 1310 Facilitators: Stanly Stabile, RN  Department: [facility]  Number of Participants: 9  Group Focus: chemical dependency education, chemical dependency issues, clarity of thought, communication, coping skills, and dual diagnosis Treatment Modality:  Behavior Modification Therapy Interventions utilized were clarification, confrontation, exploration, group exercise, patient education, problem solving, reality testing, and story telling Purpose: enhance coping skills, explore maladaptive thinking, express feelings, express irrational fears, improve communication skills, increase insight, regain self-worth, reinforce self-care, relapse prevention strategies, and trigger / craving management  Name: Doris Gonzalez Date of Birth: 08-04-1985  MR: [id number]    Level of Participation: active Quality of Participation: attentive and cooperative Interactions with others: gave feedback Mood/Affect: appropriate Triggers (if applicable):   Cognition: coherent/clear and goal directed Progress: Moderate Response:   Plan: follow-up needed  Patients Problems:  Patient Active Problem List   Diagnosis Date Noted   Alcohol dependence with alcohol-induced mood disorder (HCC) 12/05/2023   Alcohol abuse 06/27/2016   Chronic hepatitis C without hepatic coma (HCC) 06/27/2016   Major depressive disorder without psychotic features 06/27/2016   Polysubstance abuse (HCC) 06/27/2016   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

## 2023-12-06 NOTE — ED Notes (Signed)
 Patient is sleeping. Respirations equal and unlabored. No change in assessment or acuity. Routine safety checks conducted according to facility protocol.

## 2023-12-06 NOTE — ED Notes (Signed)
 Pt is sleeping . No acute distress noted.

## 2023-12-06 NOTE — Group Note (Signed)
 Group Topic: Social Support  Group Date: 12/06/2023 Start Time: 1720 End Time: 1750 Facilitators: Celinda Suzen NOVAK, NT  Department: [facility]  Number of Participants: 10  Group Focus: relapse prevention Treatment Modality:  Psychoeducation Interventions utilized were support Purpose: increase insight and regain self-worth  Name: Doris Gonzalez Date of Birth: Aug 30, 1985  MR: [id number]    Level of Participation: PT did not attend group Quality of Participation: no participation Interactions with others: no interaction Mood/Affect: n/a Triggers (if applicable): n/a Cognition: n/a Progress: Other Response: n/a Plan: follow-up needed and patient will be encouraged to attend group  Patients Problems:  Patient Active Problem List   Diagnosis Date Noted   Alcohol dependence with alcohol-induced mood disorder (HCC) 12/05/2023   Alcohol abuse 06/27/2016   Chronic hepatitis C without hepatic coma (HCC) 06/27/2016   Major depressive disorder without psychotic features 06/27/2016   Polysubstance abuse (HCC) 06/27/2016   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

## 2023-12-06 NOTE — ED Notes (Signed)
 Patient attending a social work group.  No distress at this time.  No withdrawal.  Will monitor.

## 2023-12-06 NOTE — ED Notes (Signed)
 Pt observable in group interacting with staff and peers.  Ativan  taper for ETOH withdrawal continue.  Pt is being monitored by CIWA protocol as well  Pt reports slight sweats and anxiety. Q 15 minute observations for safety continue

## 2023-12-07 DIAGNOSIS — F319 Bipolar disorder, unspecified: Secondary | ICD-10-CM | POA: Diagnosis not present

## 2023-12-07 DIAGNOSIS — F1020 Alcohol dependence, uncomplicated: Secondary | ICD-10-CM | POA: Diagnosis not present

## 2023-12-07 DIAGNOSIS — F209 Schizophrenia, unspecified: Secondary | ICD-10-CM | POA: Diagnosis not present

## 2023-12-07 DIAGNOSIS — F1414 Cocaine abuse with cocaine-induced mood disorder: Secondary | ICD-10-CM | POA: Diagnosis not present

## 2023-12-07 LAB — POCT PREGNANCY, URINE: Preg Test, Ur: NEGATIVE

## 2023-12-07 MED ORDER — DOXYCYCLINE HYCLATE 100 MG PO TABS
100.0000 mg | ORAL_TABLET | Freq: Two times a day (BID) | ORAL | Status: DC
  Administered 2023-12-07 – 2023-12-11 (×9): 100 mg via ORAL
  Filled 2023-12-07 (×9): qty 1

## 2023-12-07 MED ORDER — DIVALPROEX SODIUM ER 500 MG PO TB24
500.0000 mg | ORAL_TABLET | Freq: Every day | ORAL | Status: DC
  Administered 2023-12-07 – 2023-12-10 (×4): 500 mg via ORAL
  Filled 2023-12-07 (×4): qty 1

## 2023-12-07 NOTE — Group Note (Signed)
 Group Topic: Positive Affirmations  Group Date: 12/06/2023 Start Time: 2000 End Time: 2110 Facilitators: Lenon Lenice SAUNDERS, NT  Department: [facility]  Number of Participants: 7  Group Focus: affirmation Treatment Modality:  Individual Therapy Interventions utilized were support Purpose: express feelings  Name: Denissa Cozart Date of Birth: 04/12/1986  MR: [id number]    Level of Participation: withdrawn Quality of Participation: quiet Interactions with others: pt was present during group but did not want to participate  Mood/Affect: appropriate Triggers (if applicable):  Cognition: pt was present during group but did not want to participate  Progress: Minimal Response:  Plan: follow-up needed  Patients Problems:  Patient Active Problem List   Diagnosis Date Noted   Alcohol dependence with alcohol-induced mood disorder (HCC) 12/05/2023   Alcohol abuse 06/27/2016   Chronic hepatitis C without hepatic coma (HCC) 06/27/2016   Major depressive disorder without psychotic features 06/27/2016   Polysubstance abuse (HCC) 06/27/2016   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

## 2023-12-07 NOTE — ED Notes (Signed)
 Pt has been visible in milieu.  Group attendance noted.  Pt is reported to be feeling some better.  Stated she had been on the street for the past few days.  Denied current SI plan and intent.  Q 15 minute observations for safety continue

## 2023-12-07 NOTE — BH Assessment (Signed)
 LCSW met with patient to assess current mood, affect, physical state, and inquire about needs/goals while here in [facility] and after discharge. Patient reports she presented to [facility] with alcohol and cocaine abuse and seeking detox. She is alert and oriented X 3 with disorganized and garbled speech and is a very poor historian. Patient was not able to indicate the amount of cocaine or alcohol used and stated, a lot.  Patient reports she has been living homeless for the past 6 months and was previously staying with her sister. She has a 46 year old son who is also homeless and staying in his car. Patient has a 7 year old son who's father has full custody and she is not allowed to see her. Patient stated she was taking her medications of Depakote  and Seroquel  until middle of June.   Patient stated that she see's a psychiatrist Dr. Landry at [facility] [phone] and also see's a therapist named [place]. She stated that she ran out of medications that she received from the hospital. It is unknown if she received medications from this psychiatrist.   Patient denies having access to transportation, and reports having limited social support. Patient reports her current goal is to seek residential placement for substance use. Patient denies any prior history of outpatient or inpatient substance abuse treatment. She was [facility]. Patient currently denies any SI/HI/AVH and reports mood as calm and cooperative.   Patient aware that LCSW will send referrals out for review and will follow up to provide updates as received. Patient expressed understanding and appreciation of LCSW assistance. No other needs were reported at this time by patient.

## 2023-12-07 NOTE — ED Notes (Signed)
Pt is sleeping, no acute distress noted.

## 2023-12-07 NOTE — ED Notes (Signed)
 Pt observed lying in bed. Eyes closed respirations even and non labored. NAD q 15 minute observations continue for safety.

## 2023-12-07 NOTE — Group Note (Signed)
 Group Topic: Change and Accountability  Group Date: 12/07/2023 Start Time: 2100 End Time: 2130 Facilitators: Joan Plowman B  Department: [facility]  Number of Participants: 5  Group Focus: abuse issues, check in, chemical dependency education, chemical dependency issues, clarity of thought, communication, community group, coping skills, daily focus, depression, and goals/reality orientation Treatment Modality:  Individual Therapy and Leisure Development Interventions utilized were leisure development and support Purpose: express feelings  Name: [name] Date of Birth: 03-Jul-1985  MR: [id number]    Level of Participation: PT DID NOT ATTEND GROUPS Quality of Participation: cooperative Interactions with others: gave feedback Mood/Affect: appropriate Triggers (if applicable): NA Cognition: coherent/clear Progress: None Response: NA Plan: patient will be encouraged to go to groups.   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

## 2023-12-07 NOTE — Discharge Planning (Signed)
 SW received call back from Deer at [facility] who reviewed patients referral and stated she had tried to come last month as well and was denied due to mental health deemed as primary concerns.  SW made call to [facility] to check patients referral and left message with Joen to return call regarding referral made. Will continue to follow.

## 2023-12-07 NOTE — Group Note (Signed)
 Group Topic: Social Support  Group Date: 12/07/2023 Start Time: 1705 End Time: 1715 Facilitators: Celinda Suzen NOVAK, NT  Department: [facility]  Number of Participants: 10  Group Focus: relapse prevention and self-esteem Treatment Modality:  Psychoeducation Interventions utilized were support Purpose: increase insight and regain self-worth  Name: Doris Gonzalez Date of Birth: [birth year]  MR: [id number]    Level of Participation: when cued Quality of Participation: cooperative Interactions with others: some Mood/Affect: appropriate Triggers (if applicable): n/a Cognition: MHT had a difficult time understanding PT Progress: Other Response: PT spoke when talked to, MHT couldn't understand words Plan: patient will be encouraged to attend group  Patients Problems:  Patient Active Problem List   Diagnosis Date Noted   Alcohol dependence with alcohol-induced mood disorder (HCC) 12/05/2023   Alcohol abuse 06/27/2016   Chronic hepatitis C without hepatic coma (HCC) 06/27/2016   Major depressive disorder without psychotic features 06/27/2016   Polysubstance abuse (HCC) 06/27/2016   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

## 2023-12-07 NOTE — ED Notes (Signed)
 Patient observed/assessed at bedside lying in bed asleep. Patient alert and oriented to self and location. Patient reports tiredness. He denies A/V/H. He denies having any thoughts/plan of self harm and harm towards others. Pt Verbalizes no further complaints at this time.

## 2023-12-07 NOTE — Group Note (Signed)
 Group Topic: Recovery Basics  Group Date: 12/07/2023 Start Time: 1215 End Time: 1245 Facilitators: Carletha Iha, RN  Department: [facility]  Number of Participants: 10  Group Focus: nursing group Treatment Modality:  Psychoeducation Interventions utilized were patient education Purpose: increase insight  Name: Doris Gonzalez Date of Birth: 1986-05-06  MR: [id number]    Level of Participation: moderate Quality of Participation: attentive Interactions with others: gave feedback Mood/Affect: appropriate Triggers (if applicable):  Cognition: coherent/clear Progress: Minimal Response:  Plan: patient will be encouraged to patient will be encouraged to identify triggers and create plan for sobriety   Patients Problems:  Patient Active Problem List   Diagnosis Date Noted   Alcohol dependence with alcohol-induced mood disorder (HCC) 12/05/2023   Alcohol abuse 06/27/2016   Chronic hepatitis C without hepatic coma (HCC) 06/27/2016   Major depressive disorder without psychotic features 06/27/2016   Polysubstance abuse (HCC) 06/27/2016   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

## 2023-12-07 NOTE — Discharge Planning (Signed)
 SW spoke with Joen at [facility] and they received referral and just need patient to call in for pre screen and will let SW know after clinical review. SW provided patient with number to call today before 5:30.will continue to follow

## 2023-12-07 NOTE — ED Provider Notes (Signed)
 Behavioral Health Progress Note  Date and Time: 12/07/2023 5:43 PM Name: Doris Gonzalez MRN:  [id number]  Subjective:  Patient reports fair sleep and appetite. Denies Depression or anxiety. States she was supposed to take Doxy for chlamydia but never took on disharge and is having dysuria. Requesting to restart. Denies SI, HI or AVH. Reports eating and sleeping. Future oriented on outpatient rehab referral. States she wants to go to rehab.  Diagnosis:  Final diagnoses:  Alcohol dependence with uncomplicated withdrawal (HCC)  Cocaine dependence with withdrawal (HCC)  Bipolar II disorder (HCC)    Total Time spent with patient: 20 minutes  Past Psychiatric History:  psychiatric history of alcohol use disorder, narcotic overdose, bipolar disorder, and schizophrenia. She is prescribed Seroquel  and Depakote  but reports non-adherence to her medication regimen, and does not recall the last time she took them.  Denies prior suicide attempts    Is the patient at risk to self? Yes  Has the patient been a risk to self in the past 6 months? Yes .    Has the patient been a risk to self within the distant past? Yes   Is the patient a risk to others? Yes   Has the patient been a risk to others in the past 6 months? Yes   Has the patient been a risk to others within the distant past? No    Past Medical History:     Past Medical History:  Diagnosis Date   Alcoholism (HCC)     Anxiety         Chlamydia     Family History: patient denies, denies history of suicide attempts   Social history: Educational history: 11th grade Living situation: Homeless since 38 y/o Relationship status and parenting history: Never married;  2 children , 20 years and 55 years old Occupational history: Unemployed; on disability Reported legal history:  had court date on November 21, 2022 for assault on EMS staff 3 months ago Reports past jail time for trespassing.   Additional Social History:                          Sleep: Fair  Appetite:  Fair  Current Medications:  Current Facility-Administered Medications  Medication Dose Route Frequency Provider Last Rate Last Admin   acetaminophen  (TYLENOL ) tablet 650 mg  650 mg Oral Q6H PRN Olasunkanmi, Oluwatosin, NP   650 mg at 12/07/23 1629   haloperidol  (HALDOL ) tablet 5 mg  5 mg Oral TID PRN Olasunkanmi, Oluwatosin, NP       And   diphenhydrAMINE  (BENADRYL ) capsule 50 mg  50 mg Oral TID PRN Olasunkanmi, Oluwatosin, NP       haloperidol  lactate (HALDOL ) injection 5 mg  5 mg Intramuscular TID PRN Olasunkanmi, Oluwatosin, NP       And   diphenhydrAMINE  (BENADRYL ) injection 50 mg  50 mg Intramuscular TID PRN Olasunkanmi, Oluwatosin, NP       And   LORazepam  (ATIVAN ) injection 2 mg  2 mg Intramuscular TID PRN Olasunkanmi, Oluwatosin, NP       haloperidol  lactate (HALDOL ) injection 10 mg  10 mg Intramuscular TID PRN Olasunkanmi, Oluwatosin, NP       And   diphenhydrAMINE  (BENADRYL ) injection 50 mg  50 mg Intramuscular TID PRN Olasunkanmi, Oluwatosin, NP       And   LORazepam  (ATIVAN ) injection 2 mg  2 mg Intramuscular TID PRN Olasunkanmi, Oluwatosin, NP       divalproex  (DEPAKOTE   ER) 24 hr tablet 500 mg  500 mg Oral QHS Sabas Frett, MD       doxycycline  (VIBRA -TABS) tablet 100 mg  100 mg Oral Q12H Chrystie Hagwood, MD   100 mg at 12/07/23 1243   EPINEPHrine  (EPI-PEN) injection 0.3 mg  0.3 mg Intramuscular PRN Evelin Cake, MD       hydrOXYzine  (ATARAX ) tablet 25 mg  25 mg Oral Q6H PRN Olasunkanmi, Oluwatosin, NP       loperamide  (IMODIUM ) capsule 2-4 mg  2-4 mg Oral PRN Olasunkanmi, Oluwatosin, NP       LORazepam  (ATIVAN ) tablet 1 mg  1 mg Oral Q6H PRN Olasunkanmi, Oluwatosin, NP       LORazepam  (ATIVAN ) tablet 1 mg  1 mg Oral TID Melika Reder, MD   1 mg at 12/07/23 1626   Followed by   [START ON 12/08/2023] LORazepam  (ATIVAN ) tablet 1 mg  1 mg Oral BID Cephus Tupy, MD       Followed by   NOREEN ON 12/09/2023] LORazepam  (ATIVAN ) tablet 1 mg  1  mg Oral Daily Bernyce Brimley, MD       magnesium  hydroxide (MILK OF MAGNESIA) suspension 30 mL  30 mL Oral Daily PRN Olasunkanmi, Oluwatosin, NP       meclizine  (ANTIVERT ) tablet 25 mg  25 mg Oral TID PRN Shantice Menger, MD       nicotine  (NICODERM CQ  - dosed in mg/24 hours) patch 21 mg  21 mg Transdermal Q0600 Olasunkanmi, Oluwatosin, NP   21 mg at 12/07/23 9096   ondansetron  (ZOFRAN -ODT) disintegrating tablet 4 mg  4 mg Oral Q6H PRN Olasunkanmi, Oluwatosin, NP       QUEtiapine  (SEROQUEL ) tablet 50 mg  50 mg Oral QHS Antron Seth, MD   50 mg at 12/06/23 2116   thiamine  (VITAMIN B1) tablet 100 mg  100 mg Oral Daily Olasunkanmi, Oluwatosin, NP   100 mg at 12/07/23 0900   traZODone  (DESYREL ) tablet 50 mg  50 mg Oral QHS Olasunkanmi, Oluwatosin, NP   50 mg at 12/06/23 2115   Current Outpatient Medications  Medication Sig Dispense Refill   EPINEPHrine  0.3 mg/0.3 mL IJ SOAJ injection Inject 0.3 mg into the muscle as needed for anaphylaxis. (Patient not taking: Reported on 12/06/2023) 1 each 0    Labs  Lab Results:  Admission on 12/05/2023  Component Date Value Ref Range Status   WBC 12/05/2023 4.3  4.0 - 10.5 K/uL Final   RBC 12/05/2023 4.34  3.87 - 5.11 MIL/uL Final   Hemoglobin 12/05/2023 13.5  12.0 - 15.0 g/dL Final   HCT 92/91/7974 39.3  36.0 - 46.0 % Final   MCV 12/05/2023 90.6  80.0 - 100.0 fL Final   MCH 12/05/2023 31.1  26.0 - 34.0 pg Final   MCHC 12/05/2023 34.4  30.0 - 36.0 g/dL Final   RDW 92/91/7974 12.6  11.5 - 15.5 % Final   Platelets 12/05/2023 281  150 - 400 K/uL Final   nRBC 12/05/2023 0.0  0.0 - 0.2 % Final   Neutrophils Relative % 12/05/2023 41  % Final   Neutro Abs 12/05/2023 1.8  1.7 - 7.7 K/uL Final   Lymphocytes Relative 12/05/2023 47  % Final   Lymphs Abs 12/05/2023 2.0  0.7 - 4.0 K/uL Final   Monocytes Relative 12/05/2023 9  % Final   Monocytes Absolute 12/05/2023 0.4  0.1 - 1.0 K/uL Final   Eosinophils Relative 12/05/2023 2  % Final   Eosinophils Absolute  12/05/2023 0.1  0.0 -  0.5 K/uL Final   Basophils Relative 12/05/2023 1  % Final   Basophils Absolute 12/05/2023 0.0  0.0 - 0.1 K/uL Final   Immature Granulocytes 12/05/2023 0  % Final   Abs Immature Granulocytes 12/05/2023 0.01  0.00 - 0.07 K/uL Final   Performed at [facility], [address] [place], [place] [address]   Sodium 12/05/2023 134 (L)  135 - 145 mmol/L Final   Potassium 12/05/2023 4.6  3.5 - 5.1 mmol/L Final   Chloride 12/05/2023 97 (L)  98 - 111 mmol/L Final   CO2 12/05/2023 25  22 - 32 mmol/L Final   Glucose, Bld 12/05/2023 84  70 - 99 mg/dL Final   Glucose reference range applies only to samples taken after fasting for at least 8 hours.   BUN 12/05/2023 14  6 - 20 mg/dL Final   Creatinine, Ser 12/05/2023 0.80  0.44 - 1.00 mg/dL Final   Calcium 92/91/7974 9.4  8.9 - 10.3 mg/dL Final   Total Protein 92/91/7974 8.3 (H)  6.5 - 8.1 g/dL Final   Albumin 92/91/7974 4.0  3.5 - 5.0 g/dL Final   AST 92/91/7974 62 (H)  15 - 41 U/L Final   ALT 12/05/2023 47 (H)  0 - 44 U/L Final   Alkaline Phosphatase 12/05/2023 54  38 - 126 U/L Final   Total Bilirubin 12/05/2023 1.7 (H)  0.0 - 1.2 mg/dL Final   GFR, Estimated 12/05/2023 >60  >60 mL/min Final   Comment: (NOTE) Calculated using the CKD-EPI Creatinine Equation (2021)    Anion gap 12/05/2023 12  5 - 15 Final   Performed at [facility], [address], [place], [place] [address]   Hgb A1c MFr Bld 12/05/2023 5.1  4.8 - 5.6 % Final   Comment: (NOTE) Diagnosis of Diabetes The following HbA1c ranges recommended by the American Diabetes Association (ADA) may be used as an aid in the diagnosis of diabetes mellitus.  Hemoglobin             Suggested A1C NGSP%              Diagnosis  <5.7                   Non Diabetic  5.7-6.4                Pre-Diabetic  >6.4                   Diabetic  <7.0                   Glycemic control for                       adults with diabetes.     Mean Plasma Glucose 12/05/2023  99.67  mg/dL Final   Performed at [facility], [address] [place], [place] [address]   Magnesium  12/05/2023 1.6 (L)  1.7 - 2.4 mg/dL Final   Performed at [facility], [address], [place], [place] [address]   Alcohol, Ethyl (B) 12/05/2023 <15  <15 mg/dL Final   Comment: (NOTE) For medical purposes only. Performed at [facility], [address], [place], [place] [address]    Cholesterol 12/05/2023 271 (H)  0 - 200 mg/dL Final   Triglycerides 92/91/7974 42  <150 mg/dL Final   HDL 92/91/7974 >135  >40 mg/dL Final   Total CHOL/HDL Ratio 12/05/2023 NOT  CALCULATED  RATIO Final   VLDL 12/05/2023 8  0 - 40 mg/dL Final   LDL Cholesterol 12/05/2023 NOT CALCULATED  0 - 99 mg/dL Final   Performed at [facility], [address] [place], [place] [address]   TSH 12/05/2023 1.957  0.350 - 4.500 uIU/mL Final   Comment: Performed by a 3rd Generation assay with a functional sensitivity of <=0.01 uIU/mL. Performed at [facility], [address], [place], [place] [address]    POC Amphetamine UR 12/06/2023 None Detected  NONE DETECTED (Cut Off Level 1000 ng/mL) Final   POC Secobarbital (BAR) 12/06/2023 None Detected  NONE DETECTED (Cut Off Level 300 ng/mL) Final   POC Buprenorphine (BUP) 12/06/2023 None Detected  NONE DETECTED (Cut Off Level 10 ng/mL) Final   POC Oxazepam (BZO) 12/06/2023 Positive (A)  NONE DETECTED (Cut Off Level 300 ng/mL) Final   POC Cocaine UR 12/06/2023 Positive (A)  NONE DETECTED (Cut Off Level 300 ng/mL) Final   POC Methamphetamine UR 12/06/2023 None Detected  NONE DETECTED (Cut Off Level 1000 ng/mL) Final   POC Morphine  12/06/2023 None Detected  NONE DETECTED (Cut Off Level 300 ng/mL) Final   POC Methadone UR 12/06/2023 None Detected  NONE DETECTED (Cut Off Level 300 ng/mL) Final   POC Oxycodone  UR 12/06/2023 None Detected  NONE DETECTED (Cut Off Level 100 ng/mL) Final   POC Marijuana UR 12/06/2023 None Detected  NONE DETECTED (Cut Off  Level 50 ng/mL) Final   Valproic Acid  Lvl 12/05/2023 <10 (L)  50 - 100 ug/mL Final   Comment: RESULTS CONFIRMED BY MANUAL DILUTION Performed at [facility], [address], [place], [place] [address]    Preg Test, Ur 12/06/2023 NEGATIVE  NEGATIVE Final   Comment:        THE SENSITIVITY OF THIS METHODOLOGY IS >20 mIU/mL.   Admission on 11/29/2023, Discharged on 11/30/2023  Component Date Value Ref Range Status   Alcohol, Ethyl (B) 11/29/2023 421 (HH)  <15 mg/dL Final   Comment: CRITICAL RESULT CALLED TO, READ BACK BY AND VERIFIED WITH JINNY LOMBARD RN  11/29/2023 2005 DAVISB (NOTE) For medical purposes only. Performed at [facility], [address], [place], [place] [address]    Sodium 11/29/2023 140  135 - 145 mmol/L Final   Potassium 11/29/2023 4.4  3.5 - 5.1 mmol/L Final   Chloride 11/29/2023 108  98 - 111 mmol/L Final   CO2 11/29/2023 22  22 - 32 mmol/L Final   Glucose, Bld 11/29/2023 119 (H)  70 - 99 mg/dL Final   Glucose reference range applies only to samples taken after fasting for at least 8 hours.   BUN 11/29/2023 9  6 - 20 mg/dL Final   Creatinine, Ser 11/29/2023 0.98  0.44 - 1.00 mg/dL Final   Calcium 92/97/7974 8.4 (L)  8.9 - 10.3 mg/dL Final   Total Protein 92/97/7974 7.6  6.5 - 8.1 g/dL Final   Albumin 92/97/7974 3.8  3.5 - 5.0 g/dL Final   AST 92/97/7974 99 (H)  15 - 41 U/L Final   ALT 11/29/2023 45 (H)  0 - 44 U/L Final   Alkaline Phosphatase 11/29/2023 43  38 - 126 U/L Final   Total Bilirubin 11/29/2023 0.9  0.0 - 1.2 mg/dL Final   GFR, Estimated 11/29/2023 >60  >60 mL/min Final   Comment: (NOTE) Calculated using the CKD-EPI Creatinine Equation (2021)    Anion gap 11/29/2023 10  5 - 15 Final   Performed  at [facility], [address], [place], [place] [address]   WBC 11/29/2023 7.0  4.0 - 10.5 K/uL Final   RBC 11/29/2023 3.75 (L)  3.87 - 5.11 MIL/uL Final   Hemoglobin 11/29/2023 11.6 (L)  12.0 - 15.0 g/dL Final   HCT 92/97/7974 33.8 (L)  36.0  - 46.0 % Final   MCV 11/29/2023 90.1  80.0 - 100.0 fL Final   MCH 11/29/2023 30.9  26.0 - 34.0 pg Final   MCHC 11/29/2023 34.3  30.0 - 36.0 g/dL Final   RDW 92/97/7974 12.3  11.5 - 15.5 % Final   Platelets 11/29/2023 241  150 - 400 K/uL Final   nRBC 11/29/2023 0.0  0.0 - 0.2 % Final   Neutrophils Relative % 11/29/2023 56  % Final   Neutro Abs 11/29/2023 4.0  1.7 - 7.7 K/uL Final   Lymphocytes Relative 11/29/2023 34  % Final   Lymphs Abs 11/29/2023 2.4  0.7 - 4.0 K/uL Final   Monocytes Relative 11/29/2023 8  % Final   Monocytes Absolute 11/29/2023 0.6  0.1 - 1.0 K/uL Final   Eosinophils Relative 11/29/2023 1  % Final   Eosinophils Absolute 11/29/2023 0.1  0.0 - 0.5 K/uL Final   Basophils Relative 11/29/2023 1  % Final   Basophils Absolute 11/29/2023 0.1  0.0 - 0.1 K/uL Final   Immature Granulocytes 11/29/2023 0  % Final   Abs Immature Granulocytes 11/29/2023 0.02  0.00 - 0.07 K/uL Final   Performed at [facility], [address] [place], [place] [address]   Sodium 11/29/2023 141  135 - 145 mmol/L Final   Potassium 11/29/2023 4.4  3.5 - 5.1 mmol/L Final   Chloride 11/29/2023 110  98 - 111 mmol/L Final   BUN 11/29/2023 12  6 - 20 mg/dL Final   Creatinine, Ser 11/29/2023 1.40 (H)  0.44 - 1.00 mg/dL Final   Glucose, Bld 92/97/7974 115 (H)  70 - 99 mg/dL Final   Glucose reference range applies only to samples taken after fasting for at least 8 hours.   Calcium, Ion 11/29/2023 0.98 (L)  1.15 - 1.40 mmol/L Final   TCO2 11/29/2023 23  22 - 32 mmol/L Final   Hemoglobin 11/29/2023 12.2  12.0 - 15.0 g/dL Final   HCT 92/97/7974 36.0  36.0 - 46.0 % Final   Preg, Serum 11/29/2023 NEGATIVE  NEGATIVE Final   Comment:        THE SENSITIVITY OF THIS METHODOLOGY IS >10 mIU/mL. Performed at [facility], [address] [place], [place] [address]   Admission on 09/19/2023, Discharged on 09/19/2023  Component Date Value Ref Range Status   Sodium 09/19/2023 134 (L)  135 - 145 mmol/L  Final   Potassium 09/19/2023 4.3  3.5 - 5.1 mmol/L Final   Chloride 09/19/2023 102  98 - 111 mmol/L Final   CO2 09/19/2023 17 (L)  22 - 32 mmol/L Final   Glucose, Bld 09/19/2023 71  70 - 99 mg/dL Final   Glucose reference range applies only to samples taken after fasting for at least 8 hours.   BUN 09/19/2023 8  6 - 20 mg/dL Final   Creatinine, Ser 09/19/2023 0.77  0.44 - 1.00 mg/dL Final   Calcium 95/77/7974 8.6 (L)  8.9 - 10.3 mg/dL Final   Total Protein 95/77/7974 8.6 (H)  6.5 - 8.1 g/dL Final   Albumin 95/77/7974 3.7  3.5 - 5.0 g/dL Final   AST 95/77/7974 41  15 -  41 U/L Final   ALT 09/19/2023 37  0 - 44 U/L Final   Alkaline Phosphatase 09/19/2023 70  38 - 126 U/L Final   Total Bilirubin 09/19/2023 0.7  0.0 - 1.2 mg/dL Final   GFR, Estimated 09/19/2023 >60  >60 mL/min Final   Comment: (NOTE) Calculated using the CKD-EPI Creatinine Equation (2021)    Anion gap 09/19/2023 15  5 - 15 Final   Performed at [facility], [address], [place], [place] [address]   Alcohol, Ethyl (B) 09/19/2023 238 (H)  <15 mg/dL Final   Comment: (NOTE) For medical purposes only. Performed at [facility], [address] [place], [place] [address]    WBC 09/19/2023 8.4  4.0 - 10.5 K/uL Final   RBC 09/19/2023 4.29  3.87 - 5.11 MIL/uL Final   Hemoglobin 09/19/2023 13.3  12.0 - 15.0 g/dL Final   HCT 95/77/7974 41.6  36.0 - 46.0 % Final   MCV 09/19/2023 97.0  80.0 - 100.0 fL Final   MCH 09/19/2023 31.0  26.0 - 34.0 pg Final   MCHC 09/19/2023 32.0  30.0 - 36.0 g/dL Final   RDW 95/77/7974 13.1  11.5 - 15.5 % Final   Platelets 09/19/2023 261  150 - 400 K/uL Final   nRBC 09/19/2023 0.0  0.0 - 0.2 % Final   Performed at [facility], [address] [place], [place] [address]   Opiates 09/19/2023 NONE DETECTED  NONE DETECTED Final   Cocaine 09/19/2023 POSITIVE (A)  NONE DETECTED Final   Benzodiazepines 09/19/2023 NONE DETECTED  NONE DETECTED Final    Amphetamines 09/19/2023 POSITIVE (A)  NONE DETECTED Final   Comment: (NOTE) Trazodone  is metabolized in vivo to several metabolites, including pharmacologically active m-CPP, which is excreted in the urine. Immunoassay screens for amphetamines and MDMA have potential cross-reactivity with these compounds and may provide false positive  results.     Tetrahydrocannabinol 09/19/2023 POSITIVE (A)  NONE DETECTED Final   Barbiturates 09/19/2023 NONE DETECTED  NONE DETECTED Final   Comment: (NOTE) DRUG SCREEN FOR MEDICAL PURPOSES ONLY.  IF CONFIRMATION IS NEEDED FOR ANY PURPOSE, NOTIFY LAB WITHIN 5 DAYS.  LOWEST DETECTABLE LIMITS FOR URINE DRUG SCREEN Drug Class                     Cutoff (ng/mL) Amphetamine and metabolites    1000 Barbiturate and metabolites    200 Benzodiazepine                 200 Opiates and metabolites        300 Cocaine and metabolites        300 THC                            50 Performed at [facility], [address], [place], [place] [address]    Preg, Serum 09/19/2023 NEGATIVE  NEGATIVE Final   Comment:        THE SENSITIVITY OF THIS METHODOLOGY IS >10 mIU/mL. Performed at [facility], [address] [place], [place] [address]    SARS Coronavirus 2 by RT PCR 09/19/2023 NEGATIVE  NEGATIVE Final   Comment: (NOTE) SARS-CoV-2 target nucleic acids are NOT DETECTED.  The SARS-CoV-2 RNA is generally detectable in upper respiratory specimens during the acute phase of infection. The lowest concentration of SARS-CoV-2 viral copies this assay can detect is 138 copies/mL. A negative result  does not preclude SARS-Cov-2 infection and should not be used as the sole basis for treatment or other patient management decisions. A negative result may occur with  improper specimen collection/handling, submission of specimen other than nasopharyngeal swab, presence of viral mutation(s) within the areas targeted by this assay,  and inadequate number of viral copies(<138 copies/mL). A negative result must be combined with clinical observations, patient history, and epidemiological information. The expected result is Negative.  Fact Sheet for Patients:  BloggerCourse.com  Fact Sheet for Healthcare Providers:  SeriousBroker.it  This test is no                          t yet approved or cleared by the [place]  FDA and  has been authorized for detection and/or diagnosis of SARS-CoV-2 by FDA under an Emergency Use Authorization (EUA). This EUA will remain  in effect (meaning this test can be used) for the duration of the COVID-19 declaration under Section 564(b)(1) of the Act, 21 U.S.C.section 360bbb-3(b)(1), unless the authorization is terminated  or revoked sooner.       Influenza A by PCR 09/19/2023 NEGATIVE  NEGATIVE Final   Influenza B by PCR 09/19/2023 NEGATIVE  NEGATIVE Final   Comment: (NOTE) The Xpert Xpress SARS-CoV-2/FLU/RSV plus assay is intended as an aid in the diagnosis of influenza from Nasopharyngeal swab specimens and should not be used as a sole basis for treatment. Nasal washings and aspirates are unacceptable for Xpert Xpress SARS-CoV-2/FLU/RSV testing.  Fact Sheet for Patients: BloggerCourse.com  Fact Sheet for Healthcare Providers: SeriousBroker.it  This test is not yet approved or cleared by the [place]  FDA and has been authorized for detection and/or diagnosis of SARS-CoV-2 by FDA under an Emergency Use Authorization (EUA). This EUA will remain in effect (meaning this test can be used) for the duration of the COVID-19 declaration under Section 564(b)(1) of the Act, 21 U.S.C. section 360bbb-3(b)(1), unless the authorization is terminated or revoked.     Resp Syncytial Virus by PCR 09/19/2023 NEGATIVE  NEGATIVE Final   Comment: (NOTE) Fact Sheet for  Patients: BloggerCourse.com  Fact Sheet for Healthcare Providers: SeriousBroker.it  This test is not yet approved or cleared by the [place]  FDA and has been authorized for detection and/or diagnosis of SARS-CoV-2 by FDA under an Emergency Use Authorization (EUA). This EUA will remain in effect (meaning this test can be used) for the duration of the COVID-19 declaration under Section 564(b)(1) of the Act, 21 U.S.C. section 360bbb-3(b)(1), unless the authorization is terminated or revoked.  Performed at [facility], [address], [place], [place] [address]   Admission on 07/19/2023, Discharged on 07/20/2023  Component Date Value Ref Range Status   Sodium 07/19/2023 136  135 - 145 mmol/L Final   Potassium 07/19/2023 3.3 (L)  3.5 - 5.1 mmol/L Final   Chloride 07/19/2023 101  98 - 111 mmol/L Final   CO2 07/19/2023 19 (L)  22 - 32 mmol/L Final   Glucose, Bld 07/19/2023 101 (H)  70 - 99 mg/dL Final   Glucose reference range applies only to samples taken after fasting for at least 8 hours.   BUN 07/19/2023 9  6 - 20 mg/dL Final   Creatinine, Ser 07/19/2023 0.69  0.44 - 1.00 mg/dL Final   Calcium 97/80/7974 9.0  8.9 - 10.3 mg/dL Final   Total Protein 97/80/7974 8.3 (H)  6.5 - 8.1 g/dL Final   Albumin 97/80/7974 3.7  3.5 -  5.0 g/dL Final   AST 97/80/7974 46 (H)  15 - 41 U/L Final   ALT 07/19/2023 22  0 - 44 U/L Final   Alkaline Phosphatase 07/19/2023 41  38 - 126 U/L Final   Total Bilirubin 07/19/2023 0.3  0.0 - 1.2 mg/dL Final   GFR, Estimated 07/19/2023 >60  >60 mL/min Final   Comment: (NOTE) Calculated using the CKD-EPI Creatinine Equation (2021)    Anion gap 07/19/2023 16 (H)  5 - 15 Final   Performed at [facility], [address], [place], [place] [address]   WBC 07/19/2023 5.3  4.0 - 10.5 K/uL Final   RBC 07/19/2023 3.97  3.87 - 5.11 MIL/uL Final   Hemoglobin 07/19/2023 12.3  12.0 - 15.0 g/dL  Final   HCT 97/80/7974 37.3  36.0 - 46.0 % Final   MCV 07/19/2023 94.0  80.0 - 100.0 fL Final   MCH 07/19/2023 31.0  26.0 - 34.0 pg Final   MCHC 07/19/2023 33.0  30.0 - 36.0 g/dL Final   RDW 97/80/7974 12.2  11.5 - 15.5 % Final   Platelets 07/19/2023 292  150 - 400 K/uL Final   nRBC 07/19/2023 0.0  0.0 - 0.2 % Final   Performed at [facility], [address] [place], [place] [address]   Glucose-Capillary 07/19/2023 137 (H)  70 - 99 mg/dL Final   Glucose reference range applies only to samples taken after fasting for at least 8 hours.   Preg, Serum 07/19/2023 NEGATIVE  NEGATIVE Final   Comment:        THE SENSITIVITY OF THIS METHODOLOGY IS >10 mIU/mL. Performed at [facility], [address] [place], [place] [address]    Color, Urine 07/19/2023 STRAW (A)  YELLOW Final   APPearance 07/19/2023 CLEAR  CLEAR Final   Specific Gravity, Urine 07/19/2023 1.002 (L)  1.005 - 1.030 Final   pH 07/19/2023 5.0  5.0 - 8.0 Final   Glucose, UA 07/19/2023 NEGATIVE  NEGATIVE mg/dL Final   Hgb urine dipstick 07/19/2023 NEGATIVE  NEGATIVE Final   Bilirubin Urine 07/19/2023 NEGATIVE  NEGATIVE Final   Ketones, ur 07/19/2023 NEGATIVE  NEGATIVE mg/dL Final   Protein, ur 97/80/7974 NEGATIVE  NEGATIVE mg/dL Final   Nitrite 97/80/7974 NEGATIVE  NEGATIVE Final   Leukocytes,Ua 07/19/2023 NEGATIVE  NEGATIVE Final   Performed at [facility], [address], [place], [place] [address]   Alcohol, Ethyl (B) 07/19/2023 336 (HH)  <10 mg/dL Final   Comment: CRITICAL RESULT CALLED TO, READ BACK BY AND VERIFIED WITH ISAAC EVANS RN @1610  ON 07/19/23 BY [facility] (NOTE) Lowest detectable limit for serum alcohol is 10 mg/dL.  For medical purposes only. Performed at [facility], [address] [place], [place] [address]    Opiates 07/19/2023 NONE DETECTED  NONE DETECTED Final   Cocaine 07/19/2023 POSITIVE (A)  NONE DETECTED Final   Benzodiazepines 07/19/2023 POSITIVE (A)  NONE DETECTED Final    Amphetamines 07/19/2023 NONE DETECTED  NONE DETECTED Final   Tetrahydrocannabinol 07/19/2023 NONE DETECTED  NONE DETECTED Final   Barbiturates 07/19/2023 NONE DETECTED  NONE DETECTED Final   Comment: (NOTE) DRUG SCREEN FOR MEDICAL PURPOSES ONLY.  IF CONFIRMATION IS NEEDED FOR ANY PURPOSE, NOTIFY LAB WITHIN 5 DAYS.  LOWEST DETECTABLE LIMITS FOR URINE DRUG SCREEN Drug Class                     Cutoff (ng/mL) Amphetamine and metabolites  1000 Barbiturate and metabolites    200 Benzodiazepine                 200 Opiates and metabolites        300 Cocaine and metabolites        300 THC                            50 Performed at [facility], [address] [address], [place], [place] [address]   Admission on 07/08/2023, Discharged on 07/09/2023  Component Date Value Ref Range Status   Glucose-Capillary 07/08/2023 106 (H)  70 - 99 mg/dL Final   Glucose reference range applies only to samples taken after fasting for at least 8 hours.   WBC 07/08/2023 4.0  4.0 - 10.5 K/uL Final   RBC 07/08/2023 3.52 (L)  3.87 - 5.11 MIL/uL Final   Hemoglobin 07/08/2023 10.8 (L)  12.0 - 15.0 g/dL Final   HCT 97/91/7974 32.6 (L)  36.0 - 46.0 % Final   MCV 07/08/2023 92.6  80.0 - 100.0 fL Final   MCH 07/08/2023 30.7  26.0 - 34.0 pg Final   MCHC 07/08/2023 33.1  30.0 - 36.0 g/dL Final   RDW 97/91/7974 11.6  11.5 - 15.5 % Final   Platelets 07/08/2023 334  150 - 400 K/uL Final   nRBC 07/08/2023 0.0  0.0 - 0.2 % Final   Neutrophils Relative % 07/08/2023 24  % Final   Neutro Abs 07/08/2023 0.9 (L)  1.7 - 7.7 K/uL Final   Lymphocytes Relative 07/08/2023 63  % Final   Lymphs Abs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

## 2023-12-07 NOTE — Group Note (Signed)
 Group Topic: Healthy Self Image and Positive Change  Group Date: 12/07/2023 Start Time: 1245 End Time: 1315 Facilitators: Stanly Stabile, RN  Department: [facility]  Number of Participants: 10  Group Focus: affirmation, coping skills, problem solving, and self-esteem Treatment Modality:  Behavior Modification Therapy Interventions utilized were assignment, clarification, exploration, group exercise, patient education, problem solving, and support Purpose: enhance coping skills, explore maladaptive thinking, express irrational fears, increase insight, regain self-worth, reinforce self-care, and relapse prevention strategies  Name: Doris Gonzalez Date of Birth: 09-22-1985  MR: [id number]    Level of Participation: moderate Quality of Participation: attentive and cooperative Interactions with others: gave feedback Mood/Affect: appropriate Triggers (if applicable):   Cognition: coherent/clear Progress: Gaining insight Response:   Plan: follow-up needed  Patients Problems:  Patient Active Problem List   Diagnosis Date Noted   Alcohol dependence with alcohol-induced mood disorder (HCC) 12/05/2023   Alcohol abuse 06/27/2016   Chronic hepatitis C without hepatic coma (HCC) 06/27/2016   Major depressive disorder without psychotic features 06/27/2016   Polysubstance abuse (HCC) 06/27/2016   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

## 2023-12-08 DIAGNOSIS — F319 Bipolar disorder, unspecified: Secondary | ICD-10-CM | POA: Diagnosis not present

## 2023-12-08 DIAGNOSIS — F1020 Alcohol dependence, uncomplicated: Secondary | ICD-10-CM | POA: Diagnosis not present

## 2023-12-08 DIAGNOSIS — F1414 Cocaine abuse with cocaine-induced mood disorder: Secondary | ICD-10-CM | POA: Diagnosis not present

## 2023-12-08 DIAGNOSIS — F209 Schizophrenia, unspecified: Secondary | ICD-10-CM | POA: Diagnosis not present

## 2023-12-08 MED ORDER — ACAMPROSATE CALCIUM 333 MG PO TBEC
666.0000 mg | DELAYED_RELEASE_TABLET | Freq: Three times a day (TID) | ORAL | Status: DC
  Administered 2023-12-08 – 2023-12-11 (×9): 666 mg via ORAL
  Filled 2023-12-08 (×10): qty 2

## 2023-12-08 NOTE — ED Notes (Addendum)
 Pt is minimal to bedroom only coming out for medications. She denies SI/HI/AVH at this time. She c/o bilateral leg pain due to a fall she said she had before admission rated 6/10. PRN tylenol  given as per order. She is safe on the unit at this time with Q 15 min safety checks in place.

## 2023-12-08 NOTE — ED Notes (Signed)
 Patient is diagnosed of schizophrenia and bipolar. She has history alcohol use and substances abuse. She denied having pain. She maintains her hygiene well. BM remains as usual. Nutrition intake adequate. Cooperative and calm. Denied suicidal ideation. Med compliant. Currently no other issues noted.

## 2023-12-08 NOTE — Group Note (Signed)
 Group Topic: Relapse and Recovery  Group Date: 12/08/2023 Start Time: 2000 End Time: 2100 Facilitators: Joan Plowman B  Department: [facility]  Number of Participants: 5  Group Focus: abuse issues, chemical dependency issues, and goals/reality orientation Treatment Modality:  Leisure Development and Psychoeducation Interventions utilized were group exercise, patient education, and support Purpose: enhance coping skills and express feelings  Name: Doris Gonzalez Date of Birth: 10-27-1985  MR: [id number]    Level of Participation: PT DID NOT ATTEND GROUP Quality of Participation: cooperative Interactions with others: gave feedback Mood/Affect: appropriate Triggers (if applicable): NA Cognition: coherent/clear Progress: None Response: NA Plan: patient will be encouraged to go to groups.   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

## 2023-12-08 NOTE — ED Notes (Signed)
 Patient A&Ox4. Denies intent to harm self/others when asked. Denies A/VH. Patient denies any physical complaints when asked. No acute distress noted. Support and encouragement provided. Routine safety checks conducted according to facility protocol. Encouraged patient to notify staff if thoughts of harm toward self or others arise. Patient verbalize understanding and agreement. Will continue to monitor for safety.

## 2023-12-08 NOTE — ED Provider Notes (Addendum)
 Behavioral Health Progress Note  Date and Time: 12/08/2023 11:53 AM Name: Doris Gonzalez MRN:  [id number]  Subjective:   Patient is euthymic and smiling today. No longer drowsy or lethargic. Reports medications are helping and she would like to continue them. Denies SI, HI or AVH. She is interested in inpatient rehab. Tolerating Ativan  taper with minimal withdrawal. CIWA of 1 at time of eval.Patient reports fair sleep and appetite. Denies Depression or anxiety. Future oriented and states she has to return to court in 2 weeks. Diagnosis:  Final diagnoses:  Alcohol dependence with uncomplicated withdrawal (HCC)  Cocaine dependence with withdrawal (HCC)  Bipolar II disorder (HCC)    Total Time spent with patient: 20 minutes  Past Psychiatric History:  psychiatric history of alcohol use disorder, narcotic overdose, bipolar disorder, and schizophrenia. She is prescribed Seroquel  and Depakote  but reports non-adherence to her medication regimen, and does not recall the last time she took them.  Denies prior suicide attempts    Is the patient at risk to self? Yes  Has the patient been a risk to self in the past 6 months? Yes .    Has the patient been a risk to self within the distant past? Yes   Is the patient a risk to others? Yes   Has the patient been a risk to others in the past 6 months? Yes   Has the patient been a risk to others within the distant past? No    Past Medical History:     Past Medical History:  Diagnosis Date   Alcoholism (HCC)     Anxiety         Chlamydia     Family History: patient denies, denies history of suicide attempts   Social history: Educational history: 11th grade Living situation: Homeless since 38 y/o Relationship status and parenting history: Never married;  2 children , 20 years and 5 years old Occupational history: Unemployed; on disability Reported legal history:  had court date on November 21, 2022 for assault on EMS staff 3 months ago Reports  past jail time for trespassing.   Additional Social History:                         Sleep: Fair  Appetite:  Fair  Current Medications:  Current Facility-Administered Medications  Medication Dose Route Frequency Provider Last Rate Last Admin   acetaminophen  (TYLENOL ) tablet 650 mg  650 mg Oral Q6H PRN Olasunkanmi, Oluwatosin, NP   650 mg at 12/07/23 1629   haloperidol  (HALDOL ) tablet 5 mg  5 mg Oral TID PRN Olasunkanmi, Oluwatosin, NP       And   diphenhydrAMINE  (BENADRYL ) capsule 50 mg  50 mg Oral TID PRN Olasunkanmi, Oluwatosin, NP       haloperidol  lactate (HALDOL ) injection 5 mg  5 mg Intramuscular TID PRN Olasunkanmi, Oluwatosin, NP       And   diphenhydrAMINE  (BENADRYL ) injection 50 mg  50 mg Intramuscular TID PRN Olasunkanmi, Oluwatosin, NP       And   LORazepam  (ATIVAN ) injection 2 mg  2 mg Intramuscular TID PRN Olasunkanmi, Oluwatosin, NP       haloperidol  lactate (HALDOL ) injection 10 mg  10 mg Intramuscular TID PRN Olasunkanmi, Oluwatosin, NP       And   diphenhydrAMINE  (BENADRYL ) injection 50 mg  50 mg Intramuscular TID PRN Olasunkanmi, Oluwatosin, NP       And   LORazepam  (ATIVAN ) injection 2 mg  2  mg Intramuscular TID PRN Olasunkanmi, Oluwatosin, NP       divalproex  (DEPAKOTE  ER) 24 hr tablet 500 mg  500 mg Oral QHS Christophr Calix, MD   500 mg at 12/07/23 2142   doxycycline  (VIBRA -TABS) tablet 100 mg  100 mg Oral Q12H Haiden Rawlinson, MD   100 mg at 12/08/23 0951   EPINEPHrine  (EPI-PEN) injection 0.3 mg  0.3 mg Intramuscular PRN Ocean Kearley, MD       hydrOXYzine  (ATARAX ) tablet 25 mg  25 mg Oral Q6H PRN Olasunkanmi, Oluwatosin, NP       loperamide  (IMODIUM ) capsule 2-4 mg  2-4 mg Oral PRN Olasunkanmi, Oluwatosin, NP       LORazepam  (ATIVAN ) tablet 1 mg  1 mg Oral Q6H PRN Olasunkanmi, Oluwatosin, NP   1 mg at 12/08/23 0951   LORazepam  (ATIVAN ) tablet 1 mg  1 mg Oral BID Rechelle Niebla, MD   1 mg at 12/08/23 1005   Followed by   NOREEN ON 12/09/2023] LORazepam   (ATIVAN ) tablet 1 mg  1 mg Oral Daily Adeoluwa Silvers, MD       magnesium  hydroxide (MILK OF MAGNESIA) suspension 30 mL  30 mL Oral Daily PRN Olasunkanmi, Oluwatosin, NP       meclizine  (ANTIVERT ) tablet 25 mg  25 mg Oral TID PRN Reinhold Rickey, MD       nicotine  (NICODERM CQ  - dosed in mg/24 hours) patch 21 mg  21 mg Transdermal Q0600 Olasunkanmi, Oluwatosin, NP   21 mg at 12/08/23 0951   ondansetron  (ZOFRAN -ODT) disintegrating tablet 4 mg  4 mg Oral Q6H PRN Olasunkanmi, Oluwatosin, NP       QUEtiapine  (SEROQUEL ) tablet 50 mg  50 mg Oral QHS Dewarren Ledbetter, MD   50 mg at 12/07/23 2146   thiamine  (VITAMIN B1) tablet 100 mg  100 mg Oral Daily Olasunkanmi, Oluwatosin, NP   100 mg at 12/08/23 0951   traZODone  (DESYREL ) tablet 50 mg  50 mg Oral QHS Olasunkanmi, Oluwatosin, NP   50 mg at 12/07/23 2147   Current Outpatient Medications  Medication Sig Dispense Refill   EPINEPHrine  0.3 mg/0.3 mL IJ SOAJ injection Inject 0.3 mg into the muscle as needed for anaphylaxis. (Patient not taking: Reported on 12/06/2023) 1 each 0    Labs  Lab Results:  Admission on 12/05/2023  Component Date Value Ref Range Status   WBC 12/05/2023 4.3  4.0 - 10.5 K/uL Final   RBC 12/05/2023 4.34  3.87 - 5.11 MIL/uL Final   Hemoglobin 12/05/2023 13.5  12.0 - 15.0 g/dL Final   HCT 92/91/7974 39.3  36.0 - 46.0 % Final   MCV 12/05/2023 90.6  80.0 - 100.0 fL Final   MCH 12/05/2023 31.1  26.0 - 34.0 pg Final   MCHC 12/05/2023 34.4  30.0 - 36.0 g/dL Final   RDW 92/91/7974 12.6  11.5 - 15.5 % Final   Platelets 12/05/2023 281  150 - 400 K/uL Final   nRBC 12/05/2023 0.0  0.0 - 0.2 % Final   Neutrophils Relative % 12/05/2023 41  % Final   Neutro Abs 12/05/2023 1.8  1.7 - 7.7 K/uL Final   Lymphocytes Relative 12/05/2023 47  % Final   Lymphs Abs 12/05/2023 2.0  0.7 - 4.0 K/uL Final   Monocytes Relative 12/05/2023 9  % Final   Monocytes Absolute 12/05/2023 0.4  0.1 - 1.0 K/uL Final   Eosinophils Relative 12/05/2023 2  % Final    Eosinophils Absolute 12/05/2023 0.1  0.0 - 0.5 K/uL Final  Basophils Relative 12/05/2023 1  % Final   Basophils Absolute 12/05/2023 0.0  0.0 - 0.1 K/uL Final   Immature Granulocytes 12/05/2023 0  % Final   Abs Immature Granulocytes 12/05/2023 0.01  0.00 - 0.07 K/uL Final   Performed at [facility], [address], [place], [place] [address]   Sodium 12/05/2023 134 (L)  135 - 145 mmol/L Final   Potassium 12/05/2023 4.6  3.5 - 5.1 mmol/L Final   Chloride 12/05/2023 97 (L)  98 - 111 mmol/L Final   CO2 12/05/2023 25  22 - 32 mmol/L Final   Glucose, Bld 12/05/2023 84  70 - 99 mg/dL Final   Glucose reference range applies only to samples taken after fasting for at least 8 hours.   BUN 12/05/2023 14  6 - 20 mg/dL Final   Creatinine, Ser 12/05/2023 0.80  0.44 - 1.00 mg/dL Final   Calcium  12/05/2023 9.4  8.9 - 10.3 mg/dL Final   Total Protein 92/91/7974 8.3 (H)  6.5 - 8.1 g/dL Final   Albumin 92/91/7974 4.0  3.5 - 5.0 g/dL Final   AST 92/91/7974 62 (H)  15 - 41 U/L Final   ALT 12/05/2023 47 (H)  0 - 44 U/L Final   Alkaline Phosphatase 12/05/2023 54  38 - 126 U/L Final   Total Bilirubin 12/05/2023 1.7 (H)  0.0 - 1.2 mg/dL Final   GFR, Estimated 12/05/2023 >60  >60 mL/min Final   Comment: (NOTE) Calculated using the CKD-EPI Creatinine Equation (2021)    Anion gap 12/05/2023 12  5 - 15 Final   Performed at [facility], [address] [place], [place] [address]   Hgb A1c MFr Bld 12/05/2023 5.1  4.8 - 5.6 % Final   Comment: (NOTE) Diagnosis of Diabetes The following HbA1c ranges recommended by the American Diabetes Association (ADA) may be used as an aid in the diagnosis of diabetes mellitus.  Hemoglobin             Suggested A1C NGSP%              Diagnosis  <5.7                   Non Diabetic  5.7-6.4                Pre-Diabetic  >6.4                   Diabetic  <7.0                   Glycemic control for                       adults with diabetes.     Mean Plasma  Glucose 12/05/2023 99.67  mg/dL Final   Performed at [facility], [address], [place], [place] [address]   Magnesium  12/05/2023 1.6 (L)  1.7 - 2.4 mg/dL Final   Performed at [facility], [address] [place], [place] [address]   Alcohol, Ethyl (B) 12/05/2023 <15  <15 mg/dL Final   Comment: (NOTE) For medical purposes only. Performed at [facility], [address] [place], [place] [address]    Cholesterol 12/05/2023 271 (H)  0 - 200 mg/dL Final   Triglycerides 92/91/7974 42  <150 mg/dL Final   HDL 92/91/7974 >135  >40 mg/dL Final   Total CHOL/HDL Ratio 12/05/2023 NOT CALCULATED  RATIO Final  VLDL 12/05/2023 8  0 - 40 mg/dL Final   LDL Cholesterol 12/05/2023 NOT CALCULATED  0 - 99 mg/dL Final   Performed at [facility], [address] [place], [place] [address]   TSH 12/05/2023 1.957  0.350 - 4.500 uIU/mL Final   Comment: Performed by a 3rd Generation assay with a functional sensitivity of <=0.01 uIU/mL. Performed at [facility], [address], [place], [place] [address]    POC Amphetamine UR 12/06/2023 None Detected  NONE DETECTED (Cut Off Level 1000 ng/mL) Final   POC Secobarbital (BAR) 12/06/2023 None Detected  NONE DETECTED (Cut Off Level 300 ng/mL) Final   POC Buprenorphine (BUP) 12/06/2023 None Detected  NONE DETECTED (Cut Off Level 10 ng/mL) Final   POC Oxazepam (BZO) 12/06/2023 Positive (A)  NONE DETECTED (Cut Off Level 300 ng/mL) Final   POC Cocaine UR 12/06/2023 Positive (A)  NONE DETECTED (Cut Off Level 300 ng/mL) Final   POC Methamphetamine UR 12/06/2023 None Detected  NONE DETECTED (Cut Off Level 1000 ng/mL) Final   POC Morphine  12/06/2023 None Detected  NONE DETECTED (Cut Off Level 300 ng/mL) Final   POC Methadone UR 12/06/2023 None Detected  NONE DETECTED (Cut Off Level 300 ng/mL) Final   POC Oxycodone  UR 12/06/2023 None Detected  NONE DETECTED (Cut Off Level 100 ng/mL) Final   POC Marijuana UR 12/06/2023 None Detected  NONE  DETECTED (Cut Off Level 50 ng/mL) Final   Valproic Acid  Lvl 12/05/2023 <10 (L)  50 - 100 ug/mL Final   Comment: RESULTS CONFIRMED BY MANUAL DILUTION Performed at [facility], [address], [place], [place] [address]    Preg Test, Ur 12/06/2023 NEGATIVE  NEGATIVE Final   Comment:        THE SENSITIVITY OF THIS METHODOLOGY IS >20 mIU/mL.   Admission on 11/29/2023, Discharged on 11/30/2023  Component Date Value Ref Range Status   Alcohol, Ethyl (B) 11/29/2023 421 (HH)  <15 mg/dL Final   Comment: CRITICAL RESULT CALLED TO, READ BACK BY AND VERIFIED WITH JINNY LOMBARD RN  11/29/2023 2005 DAVISB (NOTE) For medical purposes only. Performed at [facility], [address], [place], [place] [address]    Sodium 11/29/2023 140  135 - 145 mmol/L Final   Potassium 11/29/2023 4.4  3.5 - 5.1 mmol/L Final   Chloride 11/29/2023 108  98 - 111 mmol/L Final   CO2 11/29/2023 22  22 - 32 mmol/L Final   Glucose, Bld 11/29/2023 119 (H)  70 - 99 mg/dL Final   Glucose reference range applies only to samples taken after fasting for at least 8 hours.   BUN 11/29/2023 9  6 - 20 mg/dL Final   Creatinine, Ser 11/29/2023 0.98  0.44 - 1.00 mg/dL Final   Calcium  11/29/2023 8.4 (L)  8.9 - 10.3 mg/dL Final   Total Protein 92/97/7974 7.6  6.5 - 8.1 g/dL Final   Albumin 92/97/7974 3.8  3.5 - 5.0 g/dL Final   AST 92/97/7974 99 (H)  15 - 41 U/L Final   ALT 11/29/2023 45 (H)  0 - 44 U/L Final   Alkaline Phosphatase 11/29/2023 43  38 - 126 U/L Final   Total Bilirubin 11/29/2023 0.9  0.0 - 1.2 mg/dL Final   GFR, Estimated 11/29/2023 >60  >60 mL/min Final   Comment: (NOTE) Calculated using the CKD-EPI Creatinine Equation (2021)    Anion gap 11/29/2023 10  5 - 15 Final   Performed at [facility], [address] [place], [place] [address]   WBC 11/29/2023 7.0  4.0 - 10.5 K/uL Final   RBC 11/29/2023 3.75 (L)  3.87 - 5.11 MIL/uL Final   Hemoglobin 11/29/2023 11.6 (L)  12.0 - 15.0 g/dL Final   HCT  92/97/7974 33.8 (L)  36.0 - 46.0 % Final   MCV 11/29/2023 90.1  80.0 - 100.0 fL Final   MCH 11/29/2023 30.9  26.0 - 34.0 pg Final   MCHC 11/29/2023 34.3  30.0 - 36.0 g/dL Final   RDW 92/97/7974 12.3  11.5 - 15.5 % Final   Platelets 11/29/2023 241  150 - 400 K/uL Final   nRBC 11/29/2023 0.0  0.0 - 0.2 % Final   Neutrophils Relative % 11/29/2023 56  % Final   Neutro Abs 11/29/2023 4.0  1.7 - 7.7 K/uL Final   Lymphocytes Relative 11/29/2023 34  % Final   Lymphs Abs 11/29/2023 2.4  0.7 - 4.0 K/uL Final   Monocytes Relative 11/29/2023 8  % Final   Monocytes Absolute 11/29/2023 0.6  0.1 - 1.0 K/uL Final   Eosinophils Relative 11/29/2023 1  % Final   Eosinophils Absolute 11/29/2023 0.1  0.0 - 0.5 K/uL Final   Basophils Relative 11/29/2023 1  % Final   Basophils Absolute 11/29/2023 0.1  0.0 - 0.1 K/uL Final   Immature Granulocytes 11/29/2023 0  % Final   Abs Immature Granulocytes 11/29/2023 0.02  0.00 - 0.07 K/uL Final   Performed at [facility], [address] [place], [place] [address]   Sodium 11/29/2023 141  135 - 145 mmol/L Final   Potassium 11/29/2023 4.4  3.5 - 5.1 mmol/L Final   Chloride 11/29/2023 110  98 - 111 mmol/L Final   BUN 11/29/2023 12  6 - 20 mg/dL Final   Creatinine, Ser 11/29/2023 1.40 (H)  0.44 - 1.00 mg/dL Final   Glucose, Bld 92/97/7974 115 (H)  70 - 99 mg/dL Final   Glucose reference range applies only to samples taken after fasting for at least 8 hours.   Calcium , Ion 11/29/2023 0.98 (L)  1.15 - 1.40 mmol/L Final   TCO2 11/29/2023 23  22 - 32 mmol/L Final   Hemoglobin 11/29/2023 12.2  12.0 - 15.0 g/dL Final   HCT 92/97/7974 36.0  36.0 - 46.0 % Final   Preg, Serum 11/29/2023 NEGATIVE  NEGATIVE Final   Comment:        THE SENSITIVITY OF THIS METHODOLOGY IS >10 mIU/mL. Performed at [facility], [address], [place], [place] [address]   Admission on 09/19/2023, Discharged on 09/19/2023  Component Date Value Ref Range Status   Sodium 09/19/2023 134  (L)  135 - 145 mmol/L Final   Potassium 09/19/2023 4.3  3.5 - 5.1 mmol/L Final   Chloride 09/19/2023 102  98 - 111 mmol/L Final   CO2 09/19/2023 17 (L)  22 - 32 mmol/L Final   Glucose, Bld 09/19/2023 71  70 - 99 mg/dL Final   Glucose reference range applies only to samples taken after fasting for at least 8 hours.   BUN 09/19/2023 8  6 - 20 mg/dL Final   Creatinine, Ser 09/19/2023 0.77  0.44 - 1.00 mg/dL Final   Calcium  09/19/2023 8.6 (L)  8.9 - 10.3 mg/dL Final   Total Protein 95/77/7974 8.6 (H)  6.5 - 8.1 g/dL Final   Albumin 95/77/7974 3.7  3.5 - 5.0 g/dL Final   AST 95/77/7974 41  15 - 41 U/L Final   ALT  09/19/2023 37  0 - 44 U/L Final   Alkaline Phosphatase 09/19/2023 70  38 - 126 U/L Final   Total Bilirubin 09/19/2023 0.7  0.0 - 1.2 mg/dL Final   GFR, Estimated 09/19/2023 >60  >60 mL/min Final   Comment: (NOTE) Calculated using the CKD-EPI Creatinine Equation (2021)    Anion gap 09/19/2023 15  5 - 15 Final   Performed at [facility], [address] [place], [place] [address]   Alcohol, Ethyl (B) 09/19/2023 238 (H)  <15 mg/dL Final   Comment: (NOTE) For medical purposes only. Performed at [facility], [address], [place], [place] [address]    WBC 09/19/2023 8.4  4.0 - 10.5 K/uL Final   RBC 09/19/2023 4.29  3.87 - 5.11 MIL/uL Final   Hemoglobin 09/19/2023 13.3  12.0 - 15.0 g/dL Final   HCT 95/77/7974 41.6  36.0 - 46.0 % Final   MCV 09/19/2023 97.0  80.0 - 100.0 fL Final   MCH 09/19/2023 31.0  26.0 - 34.0 pg Final   MCHC 09/19/2023 32.0  30.0 - 36.0 g/dL Final   RDW 95/77/7974 13.1  11.5 - 15.5 % Final   Platelets 09/19/2023 261  150 - 400 K/uL Final   nRBC 09/19/2023 0.0  0.0 - 0.2 % Final   Performed at [facility], [address] [place], [place] [address]   Opiates 09/19/2023 NONE DETECTED  NONE DETECTED Final   Cocaine 09/19/2023 POSITIVE (A)  NONE DETECTED Final   Benzodiazepines 09/19/2023 NONE  DETECTED  NONE DETECTED Final   Amphetamines 09/19/2023 POSITIVE (A)  NONE DETECTED Final   Comment: (NOTE) Trazodone  is metabolized in vivo to several metabolites, including pharmacologically active m-CPP, which is excreted in the urine. Immunoassay screens for amphetamines and MDMA have potential cross-reactivity with these compounds and may provide false positive  results.     Tetrahydrocannabinol 09/19/2023 POSITIVE (A)  NONE DETECTED Final   Barbiturates 09/19/2023 NONE DETECTED  NONE DETECTED Final   Comment: (NOTE) DRUG SCREEN FOR MEDICAL PURPOSES ONLY.  IF CONFIRMATION IS NEEDED FOR ANY PURPOSE, NOTIFY LAB WITHIN 5 DAYS.  LOWEST DETECTABLE LIMITS FOR URINE DRUG SCREEN Drug Class                     Cutoff (ng/mL) Amphetamine and metabolites    1000 Barbiturate and metabolites    200 Benzodiazepine                 200 Opiates and metabolites        300 Cocaine and metabolites        300 THC                            50 Performed at [facility], [address] [place], [place] [address]    Preg, Serum 09/19/2023 NEGATIVE  NEGATIVE Final   Comment:        THE SENSITIVITY OF THIS METHODOLOGY IS >10 mIU/mL. Performed at [facility], [address], [place], [place] [address]    SARS Coronavirus 2 by RT PCR 09/19/2023 NEGATIVE  NEGATIVE Final   Comment: (NOTE) SARS-CoV-2 target nucleic acids are NOT DETECTED.  The SARS-CoV-2 RNA is generally detectable in upper respiratory specimens during the acute phase of infection. The lowest concentration of SARS-CoV-2 viral copies this assay can detect is 138 copies/mL. A negative result does not preclude SARS-Cov-2 infection and  should not be used as the sole basis for treatment or other patient management decisions. A negative result may occur with  improper specimen collection/handling, submission of specimen other than nasopharyngeal swab, presence of viral mutation(s) within  the areas targeted by this assay, and inadequate number of viral copies(<138 copies/mL). A negative result must be combined with clinical observations, patient history, and epidemiological information. The expected result is Negative.  Fact Sheet for Patients:  BloggerCourse.com  Fact Sheet for Healthcare Providers:  SeriousBroker.it  This test is no                          t yet approved or cleared by the [place]  FDA and  has been authorized for detection and/or diagnosis of SARS-CoV-2 by FDA under an Emergency Use Authorization (EUA). This EUA will remain  in effect (meaning this test can be used) for the duration of the COVID-19 declaration under Section 564(b)(1) of the Act, 21 U.S.C.section 360bbb-3(b)(1), unless the authorization is terminated  or revoked sooner.       Influenza A by PCR 09/19/2023 NEGATIVE  NEGATIVE Final   Influenza B by PCR 09/19/2023 NEGATIVE  NEGATIVE Final   Comment: (NOTE) The Xpert Xpress SARS-CoV-2/FLU/RSV plus assay is intended as an aid in the diagnosis of influenza from Nasopharyngeal swab specimens and should not be used as a sole basis for treatment. Nasal washings and aspirates are unacceptable for Xpert Xpress SARS-CoV-2/FLU/RSV testing.  Fact Sheet for Patients: BloggerCourse.com  Fact Sheet for Healthcare Providers: SeriousBroker.it  This test is not yet approved or cleared by the [place]  FDA and has been authorized for detection and/or diagnosis of SARS-CoV-2 by FDA under an Emergency Use Authorization (EUA). This EUA will remain in effect (meaning this test can be used) for the duration of the COVID-19 declaration under Section 564(b)(1) of the Act, 21 U.S.C. section 360bbb-3(b)(1), unless the authorization is terminated or revoked.     Resp Syncytial Virus by PCR 09/19/2023 NEGATIVE  NEGATIVE Final   Comment:  (NOTE) Fact Sheet for Patients: BloggerCourse.com  Fact Sheet for Healthcare Providers: SeriousBroker.it  This test is not yet approved or cleared by the [place]  FDA and has been authorized for detection and/or diagnosis of SARS-CoV-2 by FDA under an Emergency Use Authorization (EUA). This EUA will remain in effect (meaning this test can be used) for the duration of the COVID-19 declaration under Section 564(b)(1) of the Act, 21 U.S.C. section 360bbb-3(b)(1), unless the authorization is terminated or revoked.  Performed at [facility], [address] [place], [place] [address]   Admission on 07/19/2023, Discharged on 07/20/2023  Component Date Value Ref Range Status   Sodium 07/19/2023 136  135 - 145 mmol/L Final   Potassium 07/19/2023 3.3 (L)  3.5 - 5.1 mmol/L Final   Chloride 07/19/2023 101  98 - 111 mmol/L Final   CO2 07/19/2023 19 (L)  22 - 32 mmol/L Final   Glucose, Bld 07/19/2023 101 (H)  70 - 99 mg/dL Final   Glucose reference range applies only to samples taken after fasting for at least 8 hours.   BUN 07/19/2023 9  6 - 20 mg/dL Final   Creatinine, Ser 07/19/2023 0.69  0.44 - 1.00 mg/dL Final   Calcium  07/19/2023 9.0  8.9 - 10.3 mg/dL Final   Total Protein 97/80/7974 8.3 (H)  6.5 - 8.1 g/dL Final   Albumin 97/80/7974 3.7  3.5 - 5.0 g/dL Final   AST  07/19/2023 46 (H)  15 - 41 U/L Final   ALT 07/19/2023 22  0 - 44 U/L Final   Alkaline Phosphatase 07/19/2023 41  38 - 126 U/L Final   Total Bilirubin 07/19/2023 0.3  0.0 - 1.2 mg/dL Final   GFR, Estimated 07/19/2023 >60  >60 mL/min Final   Comment: (NOTE) Calculated using the CKD-EPI Creatinine Equation (2021)    Anion gap 07/19/2023 16 (H)  5 - 15 Final   Performed at [facility], [address], [place], [place] [address]   WBC 07/19/2023 5.3  4.0 - 10.5 K/uL Final   RBC 07/19/2023 3.97  3.87 - 5.11 MIL/uL Final   Hemoglobin 07/19/2023  12.3  12.0 - 15.0 g/dL Final   HCT 97/80/7974 37.3  36.0 - 46.0 % Final   MCV 07/19/2023 94.0  80.0 - 100.0 fL Final   MCH 07/19/2023 31.0  26.0 - 34.0 pg Final   MCHC 07/19/2023 33.0  30.0 - 36.0 g/dL Final   RDW 97/80/7974 12.2  11.5 - 15.5 % Final   Platelets 07/19/2023 292  150 - 400 K/uL Final   nRBC 07/19/2023 0.0  0.0 - 0.2 % Final   Performed at [facility], [address] [place], [place] [address]   Glucose-Capillary 07/19/2023 137 (H)  70 - 99 mg/dL Final   Glucose reference range applies only to samples taken after fasting for at least 8 hours.   Preg, Serum 07/19/2023 NEGATIVE  NEGATIVE Final   Comment:        THE SENSITIVITY OF THIS METHODOLOGY IS >10 mIU/mL. Performed at [facility], [address] [place], [place] [address]    Color, Urine 07/19/2023 STRAW (A)  YELLOW Final   APPearance 07/19/2023 CLEAR  CLEAR Final   Specific Gravity, Urine 07/19/2023 1.002 (L)  1.005 - 1.030 Final   pH 07/19/2023 5.0  5.0 - 8.0 Final   Glucose, UA 07/19/2023 NEGATIVE  NEGATIVE mg/dL Final   Hgb urine dipstick 07/19/2023 NEGATIVE  NEGATIVE Final   Bilirubin Urine 07/19/2023 NEGATIVE  NEGATIVE Final   Ketones, ur 07/19/2023 NEGATIVE  NEGATIVE mg/dL Final   Protein, ur 97/80/7974 NEGATIVE  NEGATIVE mg/dL Final   Nitrite 97/80/7974 NEGATIVE  NEGATIVE Final   Leukocytes,Ua 07/19/2023 NEGATIVE  NEGATIVE Final   Performed at [facility], [address], [place], [place] [address]   Alcohol, Ethyl (B) 07/19/2023 336 (HH)  <10 mg/dL Final   Comment: CRITICAL RESULT CALLED TO, READ BACK BY AND VERIFIED WITH ISAAC EVANS RN @1610  ON 07/19/23 BY [facility] (NOTE) Lowest detectable limit for serum alcohol is 10 mg/dL.  For medical purposes only. Performed at [facility], [address] [place], [place] [address]    Opiates 07/19/2023 NONE DETECTED  NONE DETECTED Final   Cocaine 07/19/2023 POSITIVE (A)  NONE DETECTED Final   Benzodiazepines 07/19/2023 POSITIVE (A)   NONE DETECTED Final   Amphetamines 07/19/2023 NONE DETECTED  NONE DETECTED Final   Tetrahydrocannabinol 07/19/2023 NONE DETECTED  NONE DETECTED Final   Barbiturates 07/19/2023 NONE DETECTED  NONE DETECTED Final   Comment: (NOTE) DRUG SCREEN FOR MEDICAL PURPOSES ONLY.  IF CONFIRMATION IS NEEDED FOR ANY PURPOSE, NOTIFY LAB WITHIN 5 DAYS.  LOWEST DETECTABLE LIMITS FOR URINE DRUG SCREEN Drug Class                     Cutoff (ng/mL) Amphetamine and metabolites    1000 Barbiturate and metabolites  200 Benzodiazepine                 200 Opiates and metabolites        300 Cocaine and metabolites        300 THC                            50 Performed at [facility], [address], [place], [place] [address]   Admission on 07/08/2023, Discharged on 07/09/2023  Component Date Value Ref Range Status   Glucose-Capillary 07/08/2023 106 (H)  70 - 99 mg/dL Final   Glucose reference range applies only to samples taken after fasting for at least 8 hours.   WBC 07/08/2023 4.0  4.0 - 10.5 K/uL Final   RBC 07/08/2023 3.52 (L)  3.87 - 5.11 MIL/uL Final   Hemoglobin 07/08/2023 10.8 (L)  12.0 - 15.0 g/dL Final   HCT 97/91/7974 32.6 (L)  36.0 - 46.0 % Final   MCV 07/08/2023 92.6  80.0 - 100.0 fL Final   MCH 07/08/2023 30.7  26.0 - 34.0 pg Final   MCHC 07/08/2023 33.1  30.0 - 36.0 g/dL Final   RDW 97/91/7974 11.6  11.5 - 15.5 % Final   Platelets 07/08/2023 334  150 - 400 K/uL Final   nRBC 07/08/2023 0.0  0.0 - 0.2 % Final   Neutrophils Relative % 07/08/2023 24  % Final   Neutro Abs 07/08/2023 0.9 (L)  1.7 - 7.7 K/uL Final   Lymphocytes Relative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

## 2023-12-08 NOTE — Discharge Planning (Signed)
 SW spoke with patient regarding pre screen call with [facility] and she stated that since she has a court date on 7/22 she was advised by Joen at [facility] to call back for placement after her court date. Patient stated she wanted to DC to her sisters house on Monday 7/15 for a little while until she can go to [facility]. A cab voucher will be provided to patient upon DC.  Patient has case management services in place [facility] with [facility] and a therapist and plans to follow up as instructed upon DC.

## 2023-12-08 NOTE — Discharge Instructions (Addendum)
 You are recommended to follow up with your outpatient Mental Health providers with [facility], [phone] [address] [place], [place] [address] [place] , [phone], with in 5-7 days of discharge. And also to follow up with [facility] for residential treatment following legal court date needed to attend.    Inpatient and Outpatient substance abuse resources   Residential Treatment  Facilities Medicaid Detox No Insurance Private Insurance   [facility]) [address] [place], [place] [phone] or [phone]   No  Yes  Yes  Yes   [facility] [phone] [place], [place] [address] [phone] Admissions: 8am-3pm  M-F   [place] only  No  Yes  No    [facility] [phone]   No  Yes  No- out of pocket 16,000  Yes   [facility] ([facility]) [address] [place], [place] [phone]   Yes- No medicare  Yes   Yes, [place]   [address] only    [address] [place], [place], [address] [phone]   No  No  Yes but private pay, offers some sponsorships  Does not take insurance    [facility] [place], [place] [phone]       No  No  Yes- [place] out of pocket if not in those counties. 3,220.00 for 28 days.   No   Residential Treatment  Facilities   Medicaid  Detox  No IT trainer (multiple locations throughout the country)  Intake: [phone]    No   Yes   No   Yes   [facility]  [facility] [place], [place] [phone] (takes everyone as long as they meet detox criteria)   Yes  Yes   Yes  Yes   [address] [place], [place]  [phone] 27 locations    No  No- sober living house  90.00-130.00 per week per person  [facility] Part of  [place] Outreach [phone] [email or web address] Grice [facility] Part of [place] Outreach [phone] [email or web address]    No   [facility] [address]  [place] [phone] [email or web address]     No  No  1,200.00 a 200.00 deposit is required at start of treatment Payment plans accepted Teretha based program  No   Residential Treatment  Facilities   Medicaid  Detox  No [facility] [place] [address]  [place], [place], [address] [phone]  No  Yes       Yes  Regular Rehab: 7,500.00 28 days Dual Diagnosis: 8,900.00 28 days 7 day detox: 2.700.00 or 3,400.00 for Dual Diagnosis   Yes   Outpatient Treatment  Facilities Medicaid Detox No [facility]   [facility] [address]  [place], [place], [address] [phone]   No  No  No  Yes    [facility] Partial Hospitalization Program  (If substance abuse is secondary diagnosis) [address],  [place], [place] [address] [phone]   Yes-[place] and [place] Only for Partial   No   No   Yes- IOP  [facility]  [address] [place] [place], [place] [phone] (offers adult AND adolescent Intensive Outpatient services) (also in [place], [place], [place] and [place])      No No IOP- does  some sponsorships on individual basis Yes   Outpatient Treatment  Facilities Medicaid Detox No Insurance Private  Insurance   [facility] [address]  [place], [place], [address] [phone]   Yes  They would go to ER at [facility] then be transferred to a detox unit    Yes- self pay     Yes    [facility] [address]  [place], [place], [address] And  [address],  [place], [place] [address] [phone]   Yes  No    Yes most qualify for state funding IOP  and Opiod treatment- Offers Methadone  UHC, [place], [place]   [facility]  [phone]   No  Yes (in residential treatment program)   No   Insurance Only    [facility] [address] [place], [place],  [phone]   Yes but not for suboxone treatment  Yes- opiates with suboxone have to commit to 8 week IOP   Yes 595.00 for first visit  150.00 for prescription 150.00 a week after that for group    Yes   [facility] [address]  [place], [place] [phone]  No- has a waiting list about to be approved No Yes- but has to be self pay  500.00 for 1st 2 weeks  500 for next 2 weeks and  750 mo. Ongoing Yes   Outpatient Treatment  Facilities Medicaid Detox No Insurance [facility]   [facility] [phone] [place], [place] [phone]  No No Limited sponsorships IOP- 9,500.00 8-15 weeks If paid upfront gives a 500.00 deduction Outpatient- 1 day a week  9 weeks 4,500.00 has payment plans   Yes- out of network though   [facility] (Groups/Residential) [place], [place]  [phone]  Yes- [place] No IOP facility  Yes  No   [facility]  [address] [place] [place], [place] [phone]  No No Out of pocket only- depends on the situation  Allow people to do services on a flexible  payment plan- billed every 90 days based on income  35.00 per group- 2 hour session  *DUI assessments  and *education for charges  *evaluations   No  Outpatient Treatment  Facilities Medicaid Detox No Insurance Private  Insurance   [facility] [place]  [address]  [place], [place], [address] [phone]   Yes  No  Yes  Yes    Mobile Crisis: [facility]: [phone]  (For crisis response 24 hours a day) [facility] Hotline: [phone]   Homeless Shelters in [facility]) [address] [place], [place] Phone: [phone]   [facility] has been providing emergency shelter to those in need of a permanent residence for over 35 years. The [employer] shelter plays an important role in our community.   There are many life events that can pull someone into a downward spiral towards poverty that is very difficult to get out of. Homelessness is a problem that can affect anyone of us . [facility] is a safe and comforting place to stay, especially if you have experienced the hardship of street life.   [employer] provides a single bed and bedding to 100 adult men and women. The shelter welcomes all who are in need of housing, no one in real need is  turned away unless space is not available.   While staying at [facility], guests are offered more than just a bed for a night. Hot meals are provided and every guest has access to case management services. Case managers provide assistance with finding housing, employment, or other services that will help them gain stability. Continuous stay is based on availability, capacity, and progress towards goals.   To contact the front desk of [facility] please call  [phone] ext [phone] or ext. [phone].      [facility]) [address] [place], [place]  [address] [phone]       [facility] [address] [place], [place] [address] Phone: [phone]     [facility]  [address] [place], [place]  [address] [phone] Population served: Female veterans 18+ with substance abuse/mental health issues Eligibility: By referral only     [facility] (Women only) [address] [place]ora [place], [place] [address] Phone: [phone] Population served: Single women 18+ without dependents Documents required:  Valid ID & Social Security Card       The [employer],  [employer]. Address: [address], [place], [place] [address] Hours:   Opens 9?AM Mon Phone: [phone]  The [facility] providers a continuum of housing services to those experiencing homelessness. They provide transitional [employer] and permanent supportive housing ([place] [employer]) to disabled veterans experiencing homelessness. There is a fast track Rapid Rehousing program couples housing stability services with temporary financial assistance to quickly house individuals and families experiencing homelessness.   [employer] [address], [place] [address] Phone: [phone]      [facility] [address] [place], [place] [address] Phone: [phone]  Offers food and emergency or transitional housing to men, women, or families in need. Clients participate in programs and workshops developed to promote self-sufficiency and personal development. Call or walk in. Applications are accepted Monday, Wednesday, and Friday by appointment only. Need photo ID and proof of income    [employer] [facility] [employer] [address], [place], [place] [address] [phone] Population served: Men 18+, preference for disabled and/or veterans    [facility]  [address] [place], [place], [place] [address] [phone] Population served: Families with children.      [facility] [place] [facility] [employer]. [address], [place] [address] [phone] or [phone] Population served: Pregnant women with or without children       [employer] [facility] [address], [place], [place] [address] Check in at 6:00PM for placement at a local shelter) Phone: [phone]   [facility] Eligibility:  Must be drug and alcohol free for at least 14 days or more at the time of application. This program serves males.  Houses Engineer, civil (consulting),  Economist who serve six-month terms.  Houses are financially self-supporting; members split house expenses, which average $90.00 to $130.00 per person per week.  Any Resident who relapses must be immediately expelled. Call:  [phone]   [facility] Address: [address] [place], [place] [address]  Phone#: [phone]    [facility] Address: [address] [place], [place] [address] Phone: [phone]  -[facility] provides food, shelter and other programs and services  to the homeless men of [place] through our [employer].  By offering safe shelter, three meals a day, clean clothing, Biblical counseling, financial planning, vocational training, GED/education and employment assistance, we've helped mend the shattered lives of many homeless men since opening in 1974.  We have approximately 267 beds available, with a max of 312 beds including mats for emergency situations and currently house an average of 270 men a night.  Prospective Client Check-In Information Photo ID Required (State/ Out of State/ [facility]) - if photo ID is not available, clients are required to have a printout of a police/sheriff's criminal history report. Help out with chores around the [facility]. No sex offender of any type (pending, charged, registered and/or any other sex related offenses) will be permitted to check in. Must be willing to abide by all rules, regulations, and policies established by the [employer]. The following will be provided - shelter, food, clothing, and biblical counseling. If you or someone you know is in need of assistance at our [facility] shelter in [place], [place], please call [phone] ext. [phone].  Women Shelter for [employer] hours are Monday-Friday only.    [facility]          -(Please Call) Phone: [phone]   Provides housing and specialized case  management focused on housing stability.

## 2023-12-08 NOTE — ED Notes (Signed)
 Pt sitting in dayroom watching television and interacting with peers. No acute distress noted. No concerns voiced. Informed pt to notify staff with any needs or assistance. Pt verbalized understanding and agreement. Will continue to monitor for safety.

## 2023-12-08 NOTE — Group Note (Signed)
 Group Topic: Recovery Basics  Group Date: 12/08/2023 Start Time: 1000 End Time: 1045 Facilitators: Judi Monico RAMAN, NT  Department: [facility]  Number of Participants: 6  Group Focus: other peer support Treatment Modality:  Psychoeducation Interventions utilized were support Purpose: enhance coping skills, explore maladaptive thinking, express feelings, express irrational fears, improve communication skills, increase insight, regain self-worth, and reinforce self-care  Name: Fumi Guadron Date of Birth: [birth year]  MR: [id number]    Level of Participation: moderate Quality of Participation: attentive Interactions with others: gave feedback Mood/Affect: appropriate Triggers (if applicable): n/a Cognition: coherent/clear Progress: Gaining insight Response: n/a Plan: follow-up needed  Patients Problems:  Patient Active Problem List   Diagnosis Date Noted   Alcohol dependence with alcohol-induced mood disorder (HCC) 12/05/2023   Alcohol abuse 06/27/2016   Chronic hepatitis C without hepatic coma (HCC) 06/27/2016   Major depressive disorder without psychotic features 06/27/2016   Polysubstance abuse (HCC) 06/27/2016   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

## 2023-12-08 NOTE — Group Note (Signed)
 Group Topic: Recovery Basics  Group Date: 12/08/2023 Start Time: 1205 End Time: 1300 Facilitators: Stanly Stabile, RN  Department: [facility]  Number of Participants: 13  Group Focus: chemical dependency education, chemical dependency issues, coping skills, and dual diagnosis Treatment Modality:  Behavior Modification Therapy Interventions utilized were clarification, confrontation, exploration, patient education, problem solving, and reality testing Purpose: enhance coping skills, explore maladaptive thinking, express feelings, express irrational fears, improve communication skills, increase insight, regain self-worth, reinforce self-care, relapse prevention strategies, and trigger / craving management  Name: Doris Gonzalez Date of Birth: 25-Jun-1985  MR: [id number]    Level of Participation: active Quality of Participation: attentive and cooperative Interactions with others: gave feedback Mood/Affect: appropriate Triggers (if applicable):   Cognition: coherent/clear and goal directed Progress: Significant Response:   Plan: follow-up needed  Patients Problems:  Patient Active Problem List   Diagnosis Date Noted   Alcohol dependence with alcohol-induced mood disorder (HCC) 12/05/2023   Alcohol abuse 06/27/2016   Chronic hepatitis C without hepatic coma (HCC) 06/27/2016   Major depressive disorder without psychotic features 06/27/2016   Polysubstance abuse (HCC) 06/27/2016   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

## 2023-12-08 NOTE — [facility] Group Notes (Signed)
 SPIRITUALITY GROUP NOTE  Spirituality group facilitated by Chaplain Glennie Rodda, [facility], [facility].  Group Description: Group focused on topic of hope. Patients participated in facilitated discussion around topic, connecting with one another around experiences and definitions for hope. Group members engaged with visual explorer photos, reflecting on what hope looks like for them today. Group engaged in discussion around how their definitions of hope are present today in hospital.  Modalities: Psycho-social ed, Adlerian, Narrative, MI  Patient Progress: Present throughout group.  Engaged with facilitator and peers in group discussion.  Comments appropriate to topic.

## 2023-12-09 DIAGNOSIS — F209 Schizophrenia, unspecified: Secondary | ICD-10-CM | POA: Diagnosis not present

## 2023-12-09 DIAGNOSIS — F1414 Cocaine abuse with cocaine-induced mood disorder: Secondary | ICD-10-CM | POA: Diagnosis not present

## 2023-12-09 DIAGNOSIS — F319 Bipolar disorder, unspecified: Secondary | ICD-10-CM | POA: Diagnosis not present

## 2023-12-09 DIAGNOSIS — F1020 Alcohol dependence, uncomplicated: Secondary | ICD-10-CM | POA: Diagnosis not present

## 2023-12-09 NOTE — ED Notes (Signed)
 Patient observed going to the bathroom and going back to patient room. Patient currently sleeping and resting in bed.  RR even and unlabored, appearing in no noted distress. Environmental check complete

## 2023-12-09 NOTE — ED Notes (Signed)
 Pt sitting in dayroom watching television and interacting with peers. No acute distress noted. No concerns voiced. Informed pt to notify staff with any needs or assistance. Pt verbalized understanding and agreement. Will continue to monitor for safety.

## 2023-12-09 NOTE — ED Notes (Signed)
 Patient resting with eyes closed. Respirations even and unlabored. No distress noted. Environment secured. Plan of care ongoing, no further concerns as of present.

## 2023-12-09 NOTE — ED Provider Notes (Signed)
 Behavioral Health Progress Note  Date and Time: 12/09/2023 1:50 PM Name: Doris Gonzalez MRN:  [id number]  Subjective:   Patient is euthymic and smiling today and states I feel better than yesterday.  No longer drowsy or lethargic. Reports medications are helping and she would like to continue them. Denies any side effects and reports her energy is improving. She is future oriented on discharge to [facility] on Monday. Denies SI, HI or AVH. She is interested in inpatient rehab. Tolerating Ativan  taper with minimal withdrawal. CIWA of 0 at time of eval.Patient reports fair sleep and appetite. Denies Depression or anxiety. Future oriented and states she has to return to court in 2 weeks. Diagnosis:  Final diagnoses:  Alcohol dependence with uncomplicated withdrawal (HCC)  Cocaine dependence with withdrawal (HCC)  Bipolar II disorder (HCC)    Total Time spent with patient: 20 minutes  Past Psychiatric History:  psychiatric history of alcohol use disorder, narcotic overdose, bipolar disorder, and schizophrenia. She is prescribed Seroquel  and Depakote  but reports non-adherence to her medication regimen, and does not recall the last time she took them.  Denies prior suicide attempts    Is the patient at risk to self? Yes  Has the patient been a risk to self in the past 6 months? Yes .    Has the patient been a risk to self within the distant past? Yes   Is the patient a risk to others? Yes   Has the patient been a risk to others in the past 6 months? Yes   Has the patient been a risk to others within the distant past? No    Past Medical History:     Past Medical History:  Diagnosis Date   Alcoholism (HCC)     Anxiety         Chlamydia     Family History: patient denies, denies history of suicide attempts   Social history: Educational history: 11th grade Living situation: Homeless since 38 y/o Relationship status and parenting history: Never married;  2 children , 20 years and 88  years old Occupational history: Unemployed; on disability Reported legal history:  had court date on November 21, 2022 for assault on EMS staff 3 months ago Reports past jail time for trespassing.   Additional Social History:                         Sleep: Fair  Appetite:  Fair  Current Medications:  Current Facility-Administered Medications  Medication Dose Route Frequency Provider Last Rate Last Admin   acamprosate  (CAMPRAL ) tablet 666 mg  666 mg Oral TID Amariona Rathje, MD   666 mg at 12/09/23 9043   acetaminophen  (TYLENOL ) tablet 650 mg  650 mg Oral Q6H PRN Olasunkanmi, Oluwatosin, NP   650 mg at 12/09/23 0957   haloperidol  (HALDOL ) tablet 5 mg  5 mg Oral TID PRN Olasunkanmi, Oluwatosin, NP       And   diphenhydrAMINE  (BENADRYL ) capsule 50 mg  50 mg Oral TID PRN Olasunkanmi, Oluwatosin, NP       haloperidol  lactate (HALDOL ) injection 5 mg  5 mg Intramuscular TID PRN Olasunkanmi, Oluwatosin, NP       And   diphenhydrAMINE  (BENADRYL ) injection 50 mg  50 mg Intramuscular TID PRN Olasunkanmi, Oluwatosin, NP       And   LORazepam  (ATIVAN ) injection 2 mg  2 mg Intramuscular TID PRN Olasunkanmi, Oluwatosin, NP       haloperidol  lactate (HALDOL ) injection  10 mg  10 mg Intramuscular TID PRN Olasunkanmi, Oluwatosin, NP       And   diphenhydrAMINE  (BENADRYL ) injection 50 mg  50 mg Intramuscular TID PRN Olasunkanmi, Oluwatosin, NP       And   LORazepam  (ATIVAN ) injection 2 mg  2 mg Intramuscular TID PRN Olasunkanmi, Oluwatosin, NP       divalproex  (DEPAKOTE  ER) 24 hr tablet 500 mg  500 mg Oral QHS Shaylynn Nulty, MD   500 mg at 12/08/23 2120   doxycycline  (VIBRA -TABS) tablet 100 mg  100 mg Oral Q12H Zafiro Routson, MD   100 mg at 12/09/23 9043   EPINEPHrine  (EPI-PEN) injection 0.3 mg  0.3 mg Intramuscular PRN Malanie Koloski, MD       magnesium  hydroxide (MILK OF MAGNESIA) suspension 30 mL  30 mL Oral Daily PRN Olasunkanmi, Oluwatosin, NP       meclizine  (ANTIVERT ) tablet 25 mg  25 mg Oral  TID PRN Tejasvi Brissett, MD       nicotine  (NICODERM CQ  - dosed in mg/24 hours) patch 21 mg  21 mg Transdermal Q0600 Olasunkanmi, Oluwatosin, NP   21 mg at 12/09/23 0523   QUEtiapine  (SEROQUEL ) tablet 50 mg  50 mg Oral QHS Westlynn Fifer, MD   50 mg at 12/08/23 2120   thiamine  (VITAMIN B1) tablet 100 mg  100 mg Oral Daily Olasunkanmi, Oluwatosin, NP   100 mg at 12/09/23 0956   traZODone  (DESYREL ) tablet 50 mg  50 mg Oral QHS Olasunkanmi, Oluwatosin, NP   50 mg at 12/08/23 2125   Current Outpatient Medications  Medication Sig Dispense Refill   EPINEPHrine  0.3 mg/0.3 mL IJ SOAJ injection Inject 0.3 mg into the muscle as needed for anaphylaxis. (Patient not taking: Reported on 12/06/2023) 1 each 0    Labs  Lab Results:  Admission on 12/05/2023  Component Date Value Ref Range Status   WBC 12/05/2023 4.3  4.0 - 10.5 K/uL Final   RBC 12/05/2023 4.34  3.87 - 5.11 MIL/uL Final   Hemoglobin 12/05/2023 13.5  12.0 - 15.0 g/dL Final   HCT 92/91/7974 39.3  36.0 - 46.0 % Final   MCV 12/05/2023 90.6  80.0 - 100.0 fL Final   MCH 12/05/2023 31.1  26.0 - 34.0 pg Final   MCHC 12/05/2023 34.4  30.0 - 36.0 g/dL Final   RDW 92/91/7974 12.6  11.5 - 15.5 % Final   Platelets 12/05/2023 281  150 - 400 K/uL Final   nRBC 12/05/2023 0.0  0.0 - 0.2 % Final   Neutrophils Relative % 12/05/2023 41  % Final   Neutro Abs 12/05/2023 1.8  1.7 - 7.7 K/uL Final   Lymphocytes Relative 12/05/2023 47  % Final   Lymphs Abs 12/05/2023 2.0  0.7 - 4.0 K/uL Final   Monocytes Relative 12/05/2023 9  % Final   Monocytes Absolute 12/05/2023 0.4  0.1 - 1.0 K/uL Final   Eosinophils Relative 12/05/2023 2  % Final   Eosinophils Absolute 12/05/2023 0.1  0.0 - 0.5 K/uL Final   Basophils Relative 12/05/2023 1  % Final   Basophils Absolute 12/05/2023 0.0  0.0 - 0.1 K/uL Final   Immature Granulocytes 12/05/2023 0  % Final   Abs Immature Granulocytes 12/05/2023 0.01  0.00 - 0.07 K/uL Final   Performed at [facility], [address] [place], [place] [address]   Sodium 12/05/2023 134 (L)  135 - 145 mmol/L Final   Potassium 12/05/2023 4.6  3.5 - 5.1 mmol/L Final  Chloride 12/05/2023 97 (L)  98 - 111 mmol/L Final   CO2 12/05/2023 25  22 - 32 mmol/L Final   Glucose, Bld 12/05/2023 84  70 - 99 mg/dL Final   Glucose reference range applies only to samples taken after fasting for at least 8 hours.   BUN 12/05/2023 14  6 - 20 mg/dL Final   Creatinine, Ser 12/05/2023 0.80  0.44 - 1.00 mg/dL Final   Calcium  12/05/2023 9.4  8.9 - 10.3 mg/dL Final   Total Protein 92/91/7974 8.3 (H)  6.5 - 8.1 g/dL Final   Albumin 92/91/7974 4.0  3.5 - 5.0 g/dL Final   AST 92/91/7974 62 (H)  15 - 41 U/L Final   ALT 12/05/2023 47 (H)  0 - 44 U/L Final   Alkaline Phosphatase 12/05/2023 54  38 - 126 U/L Final   Total Bilirubin 12/05/2023 1.7 (H)  0.0 - 1.2 mg/dL Final   GFR, Estimated 12/05/2023 >60  >60 mL/min Final   Comment: (NOTE) Calculated using the CKD-EPI Creatinine Equation (2021)    Anion gap 12/05/2023 12  5 - 15 Final   Performed at [facility], [address] [place], [place] [address]   Hgb A1c MFr Bld 12/05/2023 5.1  4.8 - 5.6 % Final   Comment: (NOTE) Diagnosis of Diabetes The following HbA1c ranges recommended by the American Diabetes Association (ADA) may be used as an aid in the diagnosis of diabetes mellitus.  Hemoglobin             Suggested A1C NGSP%              Diagnosis  <5.7                   Non Diabetic  5.7-6.4                Pre-Diabetic  >6.4                   Diabetic  <7.0                   Glycemic control for                       adults with diabetes.     Mean Plasma Glucose 12/05/2023 99.67  mg/dL Final   Performed at [facility], [address] [place], [place] [address]   Magnesium  12/05/2023 1.6 (L)  1.7 - 2.4 mg/dL Final   Performed at [facility], [address] [place], [place] [address]   Alcohol, Ethyl (B) 12/05/2023 <15  <15 mg/dL Final   Comment: (NOTE) For  medical purposes only. Performed at [facility], [address], [place], [place] [address]    Cholesterol 12/05/2023 271 (H)  0 - 200 mg/dL Final   Triglycerides 92/91/7974 42  <150 mg/dL Final   HDL 92/91/7974 >135  >40 mg/dL Final   Total CHOL/HDL Ratio 12/05/2023 NOT CALCULATED  RATIO Final   VLDL 12/05/2023 8  0 - 40 mg/dL Final   LDL Cholesterol 12/05/2023 NOT CALCULATED  0 - 99 mg/dL Final   Performed at [facility], [address], [place], [place] [address]   TSH 12/05/2023 1.957  0.350 - 4.500 uIU/mL Final   Comment: Performed by a 3rd Generation assay with a functional sensitivity of <=0.01 uIU/mL. Performed at [facility], [address] [place], [place] [address]  POC Amphetamine UR 12/06/2023 None Detected  NONE DETECTED (Cut Off Level 1000 ng/mL) Final   POC Secobarbital (BAR) 12/06/2023 None Detected  NONE DETECTED (Cut Off Level 300 ng/mL) Final   POC Buprenorphine (BUP) 12/06/2023 None Detected  NONE DETECTED (Cut Off Level 10 ng/mL) Final   POC Oxazepam (BZO) 12/06/2023 Positive (A)  NONE DETECTED (Cut Off Level 300 ng/mL) Final   POC Cocaine UR 12/06/2023 Positive (A)  NONE DETECTED (Cut Off Level 300 ng/mL) Final   POC Methamphetamine UR 12/06/2023 None Detected  NONE DETECTED (Cut Off Level 1000 ng/mL) Final   POC Morphine  12/06/2023 None Detected  NONE DETECTED (Cut Off Level 300 ng/mL) Final   POC Methadone UR 12/06/2023 None Detected  NONE DETECTED (Cut Off Level 300 ng/mL) Final   POC Oxycodone  UR 12/06/2023 None Detected  NONE DETECTED (Cut Off Level 100 ng/mL) Final   POC Marijuana UR 12/06/2023 None Detected  NONE DETECTED (Cut Off Level 50 ng/mL) Final   Valproic Acid  Lvl 12/05/2023 <10 (L)  50 - 100 ug/mL Final   Comment: RESULTS CONFIRMED BY MANUAL DILUTION Performed at [facility], [address], [place], [place] [address]    Preg Test, Ur 12/06/2023 NEGATIVE  NEGATIVE Final   Comment:        THE SENSITIVITY OF  THIS METHODOLOGY IS >20 mIU/mL.   Admission on 11/29/2023, Discharged on 11/30/2023  Component Date Value Ref Range Status   Alcohol, Ethyl (B) 11/29/2023 421 (HH)  <15 mg/dL Final   Comment: CRITICAL RESULT CALLED TO, READ BACK BY AND VERIFIED WITH JINNY LOMBARD RN  11/29/2023 2005 DAVISB (NOTE) For medical purposes only. Performed at [facility], [address] [place], [place] [address]    Sodium 11/29/2023 140  135 - 145 mmol/L Final   Potassium 11/29/2023 4.4  3.5 - 5.1 mmol/L Final   Chloride 11/29/2023 108  98 - 111 mmol/L Final   CO2 11/29/2023 22  22 - 32 mmol/L Final   Glucose, Bld 11/29/2023 119 (H)  70 - 99 mg/dL Final   Glucose reference range applies only to samples taken after fasting for at least 8 hours.   BUN 11/29/2023 9  6 - 20 mg/dL Final   Creatinine, Ser 11/29/2023 0.98  0.44 - 1.00 mg/dL Final   Calcium  11/29/2023 8.4 (L)  8.9 - 10.3 mg/dL Final   Total Protein 92/97/7974 7.6  6.5 - 8.1 g/dL Final   Albumin 92/97/7974 3.8  3.5 - 5.0 g/dL Final   AST 92/97/7974 99 (H)  15 - 41 U/L Final   ALT 11/29/2023 45 (H)  0 - 44 U/L Final   Alkaline Phosphatase 11/29/2023 43  38 - 126 U/L Final   Total Bilirubin 11/29/2023 0.9  0.0 - 1.2 mg/dL Final   GFR, Estimated 11/29/2023 >60  >60 mL/min Final   Comment: (NOTE) Calculated using the CKD-EPI Creatinine Equation (2021)    Anion gap 11/29/2023 10  5 - 15 Final   Performed at [facility], [address], [place], [place] [address]   WBC 11/29/2023 7.0  4.0 - 10.5 K/uL Final   RBC 11/29/2023 3.75 (L)  3.87 - 5.11 MIL/uL Final   Hemoglobin 11/29/2023 11.6 (L)  12.0 - 15.0 g/dL Final   HCT 92/97/7974 33.8 (L)  36.0 - 46.0 % Final   MCV 11/29/2023 90.1  80.0 - 100.0 fL Final   MCH 11/29/2023 30.9  26.0 - 34.0 pg Final   MCHC 11/29/2023 34.3  30.0 - 36.0 g/dL Final   RDW 92/97/7974 12.3  11.5 - 15.5 % Final   Platelets 11/29/2023 241  150 - 400 K/uL Final   nRBC 11/29/2023 0.0  0.0 - 0.2 % Final    Neutrophils Relative % 11/29/2023 56  % Final   Neutro Abs 11/29/2023 4.0  1.7 - 7.7 K/uL Final   Lymphocytes Relative 11/29/2023 34  % Final   Lymphs Abs 11/29/2023 2.4  0.7 - 4.0 K/uL Final   Monocytes Relative 11/29/2023 8  % Final   Monocytes Absolute 11/29/2023 0.6  0.1 - 1.0 K/uL Final   Eosinophils Relative 11/29/2023 1  % Final   Eosinophils Absolute 11/29/2023 0.1  0.0 - 0.5 K/uL Final   Basophils Relative 11/29/2023 1  % Final   Basophils Absolute 11/29/2023 0.1  0.0 - 0.1 K/uL Final   Immature Granulocytes 11/29/2023 0  % Final   Abs Immature Granulocytes 11/29/2023 0.02  0.00 - 0.07 K/uL Final   Performed at [facility], [address] [place], [place] [address]   Sodium 11/29/2023 141  135 - 145 mmol/L Final   Potassium 11/29/2023 4.4  3.5 - 5.1 mmol/L Final   Chloride 11/29/2023 110  98 - 111 mmol/L Final   BUN 11/29/2023 12  6 - 20 mg/dL Final   Creatinine, Ser 11/29/2023 1.40 (H)  0.44 - 1.00 mg/dL Final   Glucose, Bld 92/97/7974 115 (H)  70 - 99 mg/dL Final   Glucose reference range applies only to samples taken after fasting for at least 8 hours.   Calcium , Ion 11/29/2023 0.98 (L)  1.15 - 1.40 mmol/L Final   TCO2 11/29/2023 23  22 - 32 mmol/L Final   Hemoglobin 11/29/2023 12.2  12.0 - 15.0 g/dL Final   HCT 92/97/7974 36.0  36.0 - 46.0 % Final   Preg, Serum 11/29/2023 NEGATIVE  NEGATIVE Final   Comment:        THE SENSITIVITY OF THIS METHODOLOGY IS >10 mIU/mL. Performed at [facility], [address], [place], [place] [address]   Admission on 09/19/2023, Discharged on 09/19/2023  Component Date Value Ref Range Status   Sodium 09/19/2023 134 (L)  135 - 145 mmol/L Final   Potassium 09/19/2023 4.3  3.5 - 5.1 mmol/L Final   Chloride 09/19/2023 102  98 - 111 mmol/L Final   CO2 09/19/2023 17 (L)  22 - 32 mmol/L Final   Glucose, Bld 09/19/2023 71  70 - 99 mg/dL Final   Glucose reference range applies only to samples taken after fasting for at least 8  hours.   BUN 09/19/2023 8  6 - 20 mg/dL Final   Creatinine, Ser 09/19/2023 0.77  0.44 - 1.00 mg/dL Final   Calcium  09/19/2023 8.6 (L)  8.9 - 10.3 mg/dL Final   Total Protein 95/77/7974 8.6 (H)  6.5 - 8.1 g/dL Final   Albumin 95/77/7974 3.7  3.5 - 5.0 g/dL Final   AST 95/77/7974 41  15 - 41 U/L Final   ALT 09/19/2023 37  0 - 44 U/L Final   Alkaline Phosphatase 09/19/2023 70  38 - 126 U/L Final   Total Bilirubin 09/19/2023 0.7  0.0 - 1.2 mg/dL Final   GFR, Estimated 09/19/2023 >60  >60 mL/min Final   Comment: (NOTE) Calculated using the CKD-EPI Creatinine Equation (2021)    Anion gap 09/19/2023 15  5 - 15 Final   Performed at [facility], [address], [place], [place] [address]  Alcohol, Ethyl (B) 09/19/2023 238 (H)  <15 mg/dL Final   Comment: (NOTE) For medical purposes only. Performed at [facility], [address] [place], [place] [address]    WBC 09/19/2023 8.4  4.0 - 10.5 K/uL Final   RBC 09/19/2023 4.29  3.87 - 5.11 MIL/uL Final   Hemoglobin 09/19/2023 13.3  12.0 - 15.0 g/dL Final   HCT 95/77/7974 41.6  36.0 - 46.0 % Final   MCV 09/19/2023 97.0  80.0 - 100.0 fL Final   MCH 09/19/2023 31.0  26.0 - 34.0 pg Final   MCHC 09/19/2023 32.0  30.0 - 36.0 g/dL Final   RDW 95/77/7974 13.1  11.5 - 15.5 % Final   Platelets 09/19/2023 261  150 - 400 K/uL Final   nRBC 09/19/2023 0.0  0.0 - 0.2 % Final   Performed at [facility], [address] [place], [place] [address]   Opiates 09/19/2023 NONE DETECTED  NONE DETECTED Final   Cocaine 09/19/2023 POSITIVE (A)  NONE DETECTED Final   Benzodiazepines 09/19/2023 NONE DETECTED  NONE DETECTED Final   Amphetamines 09/19/2023 POSITIVE (A)  NONE DETECTED Final   Comment: (NOTE) Trazodone  is metabolized in vivo to several metabolites, including pharmacologically active m-CPP, which is excreted in the urine. Immunoassay screens for amphetamines and MDMA have  potential cross-reactivity with these compounds and may provide false positive  results.     Tetrahydrocannabinol 09/19/2023 POSITIVE (A)  NONE DETECTED Final   Barbiturates 09/19/2023 NONE DETECTED  NONE DETECTED Final   Comment: (NOTE) DRUG SCREEN FOR MEDICAL PURPOSES ONLY.  IF CONFIRMATION IS NEEDED FOR ANY PURPOSE, NOTIFY LAB WITHIN 5 DAYS.  LOWEST DETECTABLE LIMITS FOR URINE DRUG SCREEN Drug Class                     Cutoff (ng/mL) Amphetamine and metabolites    1000 Barbiturate and metabolites    200 Benzodiazepine                 200 Opiates and metabolites        300 Cocaine and metabolites        300 THC                            50 Performed at [facility], [address] [place], [place] [address]    Preg, Serum 09/19/2023 NEGATIVE  NEGATIVE Final   Comment:        THE SENSITIVITY OF THIS METHODOLOGY IS >10 mIU/mL. Performed at [facility], [address], [place], [place] [address]    SARS Coronavirus 2 by RT PCR 09/19/2023 NEGATIVE  NEGATIVE Final   Comment: (NOTE) SARS-CoV-2 target nucleic acids are NOT DETECTED.  The SARS-CoV-2 RNA is generally detectable in upper respiratory specimens during the acute phase of infection. The lowest concentration of SARS-CoV-2 viral copies this assay can detect is 138 copies/mL. A negative result does not preclude SARS-Cov-2 infection and should not be used as the sole basis for treatment or other patient management decisions. A negative result may occur with  improper specimen collection/handling, submission of specimen other than nasopharyngeal swab, presence of viral mutation(s) within the areas targeted by this assay, and inadequate number of viral copies(<138 copies/mL). A negative result must be combined with clinical observations, patient history, and epidemiological information. The expected result is Negative.  Fact Sheet for Patients:   BloggerCourse.com  Fact Sheet for Healthcare  Providers:  SeriousBroker.it  This test is no                          t yet approved or cleared by the [place]  FDA and  has been authorized for detection and/or diagnosis of SARS-CoV-2 by FDA under an Emergency Use Authorization (EUA). This EUA will remain  in effect (meaning this test can be used) for the duration of the COVID-19 declaration under Section 564(b)(1) of the Act, 21 U.S.C.section 360bbb-3(b)(1), unless the authorization is terminated  or revoked sooner.       Influenza A by PCR 09/19/2023 NEGATIVE  NEGATIVE Final   Influenza B by PCR 09/19/2023 NEGATIVE  NEGATIVE Final   Comment: (NOTE) The Xpert Xpress SARS-CoV-2/FLU/RSV plus assay is intended as an aid in the diagnosis of influenza from Nasopharyngeal swab specimens and should not be used as a sole basis for treatment. Nasal washings and aspirates are unacceptable for Xpert Xpress SARS-CoV-2/FLU/RSV testing.  Fact Sheet for Patients: BloggerCourse.com  Fact Sheet for Healthcare Providers: SeriousBroker.it  This test is not yet approved or cleared by the [place]  FDA and has been authorized for detection and/or diagnosis of SARS-CoV-2 by FDA under an Emergency Use Authorization (EUA). This EUA will remain in effect (meaning this test can be used) for the duration of the COVID-19 declaration under Section 564(b)(1) of the Act, 21 U.S.C. section 360bbb-3(b)(1), unless the authorization is terminated or revoked.     Resp Syncytial Virus by PCR 09/19/2023 NEGATIVE  NEGATIVE Final   Comment: (NOTE) Fact Sheet for Patients: BloggerCourse.com  Fact Sheet for Healthcare Providers: SeriousBroker.it  This test is not yet approved or cleared by the [place]  FDA and has been authorized for detection and/or  diagnosis of SARS-CoV-2 by FDA under an Emergency Use Authorization (EUA). This EUA will remain in effect (meaning this test can be used) for the duration of the COVID-19 declaration under Section 564(b)(1) of the Act, 21 U.S.C. section 360bbb-3(b)(1), unless the authorization is terminated or revoked.  Performed at [facility], [address] [place], [place] [address]   Admission on 07/19/2023, Discharged on 07/20/2023  Component Date Value Ref Range Status   Sodium 07/19/2023 136  135 - 145 mmol/L Final   Potassium 07/19/2023 3.3 (L)  3.5 - 5.1 mmol/L Final   Chloride 07/19/2023 101  98 - 111 mmol/L Final   CO2 07/19/2023 19 (L)  22 - 32 mmol/L Final   Glucose, Bld 07/19/2023 101 (H)  70 - 99 mg/dL Final   Glucose reference range applies only to samples taken after fasting for at least 8 hours.   BUN 07/19/2023 9  6 - 20 mg/dL Final   Creatinine, Ser 07/19/2023 0.69  0.44 - 1.00 mg/dL Final   Calcium  07/19/2023 9.0  8.9 - 10.3 mg/dL Final   Total Protein 97/80/7974 8.3 (H)  6.5 - 8.1 g/dL Final   Albumin 97/80/7974 3.7  3.5 - 5.0 g/dL Final   AST 97/80/7974 46 (H)  15 - 41 U/L Final   ALT 07/19/2023 22  0 - 44 U/L Final   Alkaline Phosphatase 07/19/2023 41  38 - 126 U/L Final   Total Bilirubin 07/19/2023 0.3  0.0 - 1.2 mg/dL Final   GFR, Estimated 07/19/2023 >60  >60 mL/min Final   Comment: (NOTE) Calculated using the CKD-EPI Creatinine Equation (2021)    Anion gap 07/19/2023 16 (H)  5 - 15 Final   Performed at  [facility], [address], [place], [place] [address]   WBC 07/19/2023 5.3  4.0 - 10.5 K/uL Final   RBC 07/19/2023 3.97  3.87 - 5.11 MIL/uL Final   Hemoglobin 07/19/2023 12.3  12.0 - 15.0 g/dL Final   HCT 97/80/7974 37.3  36.0 - 46.0 % Final   MCV 07/19/2023 94.0  80.0 - 100.0 fL Final   MCH 07/19/2023 31.0  26.0 - 34.0 pg Final   MCHC 07/19/2023 33.0  30.0 - 36.0 g/dL Final   RDW 97/80/7974 12.2  11.5 - 15.5 % Final   Platelets  07/19/2023 292  150 - 400 K/uL Final   nRBC 07/19/2023 0.0  0.0 - 0.2 % Final   Performed at [facility], [address] [place], [place] [address]   Glucose-Capillary 07/19/2023 137 (H)  70 - 99 mg/dL Final   Glucose reference range applies only to samples taken after fasting for at least 8 hours.   Preg, Serum 07/19/2023 NEGATIVE  NEGATIVE Final   Comment:        THE SENSITIVITY OF THIS METHODOLOGY IS >10 mIU/mL. Performed at [facility], [address], [place], [place] [address]    Color, Urine 07/19/2023 STRAW (A)  YELLOW Final   APPearance 07/19/2023 CLEAR  CLEAR Final   Specific Gravity, Urine 07/19/2023 1.002 (L)  1.005 - 1.030 Final   pH 07/19/2023 5.0  5.0 - 8.0 Final   Glucose, UA 07/19/2023 NEGATIVE  NEGATIVE mg/dL Final   Hgb urine dipstick 07/19/2023 NEGATIVE  NEGATIVE Final   Bilirubin Urine 07/19/2023 NEGATIVE  NEGATIVE Final   Ketones, ur 07/19/2023 NEGATIVE  NEGATIVE mg/dL Final   Protein, ur 97/80/7974 NEGATIVE  NEGATIVE mg/dL Final   Nitrite 97/80/7974 NEGATIVE  NEGATIVE Final   Leukocytes,Ua 07/19/2023 NEGATIVE  NEGATIVE Final   Performed at [facility], [address], [place], [place] [address]   Alcohol, Ethyl (B) 07/19/2023 336 (HH)  <10 mg/dL Final   Comment: CRITICAL RESULT CALLED TO, READ BACK BY AND VERIFIED WITH ISAAC EVANS RN @1610  ON 07/19/23 BY [facility]AB (NOTE) Lowest detectable limit for serum alcohol is 10 mg/dL.  For medical purposes only. Performed at [facility], [address], [place], [place] [address]    Opiates 07/19/2023 NONE DETECTED  NONE DETECTED Final   Cocaine 07/19/2023 POSITIVE (A)  NONE DETECTED Final   Benzodiazepines 07/19/2023 POSITIVE (A)  NONE DETECTED Final   Amphetamines 07/19/2023 NONE DETECTED  NONE DETECTED Final   Tetrahydrocannabinol 07/19/2023 NONE DETECTED  NONE DETECTED Final   Barbiturates 07/19/2023 NONE DETECTED  NONE DETECTED Final   Comment: (NOTE) DRUG SCREEN FOR MEDICAL  PURPOSES ONLY.  IF CONFIRMATION IS NEEDED FOR ANY PURPOSE, NOTIFY LAB WITHIN 5 DAYS.  LOWEST DETECTABLE LIMITS FOR URINE DRUG SCREEN Drug Class                     Cutoff (ng/mL) Amphetamine and metabolites    1000 Barbiturate and metabolites    200 Benzodiazepine                 200 Opiates and metabolites        300 Cocaine and metabolites        300 THC                            50 Performed at [facility], [address] [place],  [place] [address]   Admission on 07/08/2023, Discharged on 07/09/2023  Component Date Value Ref Range Status   Glucose-Capillary 07/08/2023 106 (H)  70 - 99 mg/dL Final   Glucose reference range applies only to samples taken after fasting for at least 8 hours.   WBC 07/08/2023 4.0  4.0 - 10.5 K/uL Final   RBC 07/08/2023 3.52 (L)  3.87 - 5.11 MIL/uL Final   Hemoglobin 07/08/2023 10.8 (L)  12.0 - 15.0 g/dL Final   HCT 97/91/7974 32.6 (L)  36.0 - 46.0 % Final   MCV 07/08/2023 92.6  80.0 - 100.0 fL Final   MCH 07/08/2023 30.7  26.0 - 34.0 pg Final   MCHC 07/08/2023 33.1  30.0 - 36.0 g/dL Final   RDW 97/91/7974 11.6  11.5 - 15.5 % Final   Platelets 07/08/2023 334  150 - 400 K/uL Final   nRBC 07/08/2023 0.0  0.0 - 0.2 % Final   Neutrophils Relative % 07/08/2023 24  % Final   Neutro Abs 07/08/2023 0.9 (L)  1.7 - 7.7 K/uL Final   Lymphocytes Relative 07/08/2023 63  % Final   Lymphs Abs 07/08/2023 2.5  0.7 - 4.0 K/uL Final   Monocytes Relative 07/08/2023 9  % Final   Monocytes Absolute 07/08/2023 0.3  0.1 - 1.0 K/uL Final   Eosinophils Relative 07/08/2023 2  % Final   Eosinophils Absolute 07/08/2023 0.1  0.0 - 0.5 K/uL Final   Basophils Relative 07/08/2023 1  % Final   Basophils Absolute

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
## 2023-12-09 NOTE — ED Notes (Signed)
 Patient resting quietly in bed with eyes closed with unlabored breathing. Q 15 minute safety checks remain in place.  Pt remains safe on the unit at this time.

## 2023-12-09 NOTE — Group Note (Signed)
 Group Topic: Relapse and Recovery  Group Date: 12/09/2023 Start Time: 2000 End Time: 2100 Facilitators: Joan Plowman B  Department: [facility]  Number of Participants: 7  Group Focus: abuse issues, coping skills, and daily focus Treatment Modality:  Leisure Development Interventions utilized were leisure development Purpose: enhance coping skills, express feelings, increase insight, and relapse prevention strategies  Name: Chayna Surratt Date of Birth: [birth year]  MR: [id number]    Level of Participation: active Quality of Participation: attentive, cooperative, and engaged Interactions with others: gave feedback Mood/Affect: appropriate Triggers (if applicable): NA Cognition: coherent/clear Progress: Gaining insight Response: NA Plan: patient will be encouraged to go to groups.  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

## 2023-12-09 NOTE — ED Notes (Signed)
 Patient A&Ox4. Denies intent to harm self/others when asked. Denies A/VH. Patient denies any physical complaints when asked. No acute distress noted. Support and encouragement provided. Routine safety checks conducted according to facility protocol. Encouraged patient to notify staff if thoughts of harm toward self or others arise. Patient verbalize understanding and agreement. Will continue to monitor for safety.

## 2023-12-09 NOTE — ED Notes (Signed)
 The patient is sitting in the dayroom, watching television, and socializing with other pts. No distress noted. Environment is secured. Plan of care ongoing, no further concerns as of present. Patient expresses no other needs at this time.

## 2023-12-10 DIAGNOSIS — F1020 Alcohol dependence, uncomplicated: Secondary | ICD-10-CM | POA: Diagnosis not present

## 2023-12-10 DIAGNOSIS — F1414 Cocaine abuse with cocaine-induced mood disorder: Secondary | ICD-10-CM | POA: Diagnosis not present

## 2023-12-10 DIAGNOSIS — F209 Schizophrenia, unspecified: Secondary | ICD-10-CM | POA: Diagnosis not present

## 2023-12-10 DIAGNOSIS — F319 Bipolar disorder, unspecified: Secondary | ICD-10-CM | POA: Diagnosis not present

## 2023-12-10 LAB — CBC WITH DIFFERENTIAL/PLATELET
Abs Immature Granulocytes: 0.05 K/uL (ref 0.00–0.07)
Basophils Absolute: 0 K/uL (ref 0.0–0.1)
Basophils Relative: 1 %
Eosinophils Absolute: 0.1 K/uL (ref 0.0–0.5)
Eosinophils Relative: 3 %
HCT: 33 % — ABNORMAL LOW (ref 36.0–46.0)
Hemoglobin: 10.8 g/dL — ABNORMAL LOW (ref 12.0–15.0)
Immature Granulocytes: 1 %
Lymphocytes Relative: 47 %
Lymphs Abs: 1.8 K/uL (ref 0.7–4.0)
MCH: 30.6 pg (ref 26.0–34.0)
MCHC: 32.7 g/dL (ref 30.0–36.0)
MCV: 93.5 fL (ref 80.0–100.0)
Monocytes Absolute: 0.4 K/uL (ref 0.1–1.0)
Monocytes Relative: 10 %
Neutro Abs: 1.5 K/uL — ABNORMAL LOW (ref 1.7–7.7)
Neutrophils Relative %: 38 %
Platelets: 207 K/uL (ref 150–400)
RBC: 3.53 MIL/uL — ABNORMAL LOW (ref 3.87–5.11)
RDW: 13 % (ref 11.5–15.5)
WBC: 3.9 K/uL — ABNORMAL LOW (ref 4.0–10.5)
nRBC: 0 % (ref 0.0–0.2)

## 2023-12-10 LAB — COMPREHENSIVE METABOLIC PANEL WITH GFR
ALT: 25 U/L (ref 0–44)
AST: 30 U/L (ref 15–41)
Albumin: 2.9 g/dL — ABNORMAL LOW (ref 3.5–5.0)
Alkaline Phosphatase: 36 U/L — ABNORMAL LOW (ref 38–126)
Anion gap: 8 (ref 5–15)
BUN: 15 mg/dL (ref 6–20)
CO2: 24 mmol/L (ref 22–32)
Calcium: 9 mg/dL (ref 8.9–10.3)
Chloride: 103 mmol/L (ref 98–111)
Creatinine, Ser: 0.75 mg/dL (ref 0.44–1.00)
GFR, Estimated: >60 mL/min (ref >60)
Glucose, Bld: 123 mg/dL — ABNORMAL HIGH (ref 70–99)
Potassium: 3.9 mmol/L (ref 3.5–5.1)
Sodium: 135 mmol/L (ref 135–145)
Total Bilirubin: 0.7 mg/dL (ref 0.0–1.2)
Total Protein: 6.2 g/dL — ABNORMAL LOW (ref 6.5–8.1)

## 2023-12-10 LAB — VALPROIC ACID LEVEL: Valproic Acid Lvl: 31 ug/mL — ABNORMAL LOW (ref 50–100)

## 2023-12-10 MED ORDER — CLONIDINE HCL 0.1 MG PO TABS: 0.1000 mg | ORAL_TABLET | Freq: Three times a day (TID) | ORAL | Status: DC | PRN

## 2023-12-10 NOTE — Group Note (Signed)
 Group Topic: Decisional Balance/Substance Abuse  Group Date: 12/10/2023 Start Time: 1000 End Time: 1100 Facilitators: Alyse Leilani LABOR, NT  Department: [facility]  Number of Participants: 9  Group Focus: self-awareness, self-esteem, and social skills Treatment Modality:  Individual Therapy Interventions utilized were clarification Purpose: improve communication skills, regain self-worth, and reinforce self-care  Name: Doris Gonzalez Date of Birth: 08-03-1985  MR: [id number]    Pt did not attend group Patients Problems:  Patient Active Problem List   Diagnosis Date Noted   Alcohol dependence with alcohol-induced mood disorder (HCC) 12/05/2023   Alcohol abuse 06/27/2016   Chronic hepatitis C without hepatic coma (HCC) 06/27/2016   Major depressive disorder without psychotic features 06/27/2016   Polysubstance abuse (HCC) 06/27/2016   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

## 2023-12-10 NOTE — Group Note (Signed)
 Group Topic: Communication  Group Date: 12/10/2023 Start Time: 0900 End Time: 1000 Facilitators: Herold Lajuana NOVAK, RN  Department: [facility]  Number of Participants: 9  Group Focus: daily focus Treatment Modality:  Individual Therapy Interventions utilized were patient education Purpose: increase insight  Name: Doris Gonzalez Date of Birth: Dec 01, 1985  MR: [id number]      Patients Problems:  Level of Participation: active Quality of Participation: attentive Interactions with others: gave feedback Mood/Affect: appropriate Triggers (if applicable): none identified Cognition: coherent/clear Progress: Gaining insight Response: pt verbalized understanding of medications given and indication of each Plan: patient will be encouraged to remain tx compliant for progression

## 2023-12-10 NOTE — ED Notes (Signed)
 Pt sleeping in no acute distress. RR even and unlabored. Environment secured. Will continue to monitor for safety.

## 2023-12-10 NOTE — ED Notes (Addendum)
 Provider Roxianne, NP aware of patient's labs. Per provider day shift provider will address labs for treatment.

## 2023-12-10 NOTE — ED Notes (Signed)
 Patient calm and watching TV with other patients in dayroom. RR even and unlabored, appearing in no noted distress. Environmental check complete

## 2023-12-10 NOTE — ED Notes (Signed)
 Patient currently sleeping and resting in bed. Pt observed/assessed in patient room. RR even and unlabored, appearing in no noted distress. Environmental check complete

## 2023-12-10 NOTE — ED Provider Notes (Signed)
 Behavioral Health Progress Note  Date and Time: 12/10/2023 12:30 PM Name: Doris Gonzalez MRN:  [id number]  Subjective:   Patient is euthymic and smiling the last 2 days. Depakote  level was 33. Patient denies any side effects and states I've been feeling better.  No longer drowsy or lethargic. Reports medications are helping and she would like to continue them. Denies any side effects and reports her energy is improving. She is future oriented rehab services. Denies SI, HI or AVH. She is interested in inpatient rehab but has a court date in 2 weeks. Tolerating Ativan  taper with minimal withdrawal. CIWA of 0 at time of eval.Patient reports fair sleep and appetite. Denies Depression or anxiety. Future oriented and states she has to return to court in 2 weeks. Diagnosis:  Final diagnoses:  Alcohol dependence with uncomplicated withdrawal (HCC)  Cocaine dependence with withdrawal (HCC)  Bipolar II disorder (HCC)    Total Time spent with patient: 20 minutes  Past Psychiatric History:  psychiatric history of alcohol use disorder, narcotic overdose, bipolar disorder, and schizophrenia. She is prescribed Seroquel  and Depakote  but reports non-adherence to her medication regimen, and does not recall the last time she took them.  Denies prior suicide attempts    Is the patient at risk to self? Yes  Has the patient been a risk to self in the past 6 months? Yes .    Has the patient been a risk to self within the distant past? Yes   Is the patient a risk to others? Yes   Has the patient been a risk to others in the past 6 months? Yes   Has the patient been a risk to others within the distant past? No    Past Medical History:     Past Medical History:  Diagnosis Date   Alcoholism (HCC)     Anxiety         Chlamydia     Family History: patient denies, denies history of suicide attempts   Social history: Educational history: 11th grade Living situation: Homeless since 38 y/o Relationship  status and parenting history: Never married;  2 children , 20 years and 27 years old Occupational history: Unemployed; on disability Reported legal history:  had court date on November 21, 2022 for assault on EMS staff 3 months ago Reports past jail time for trespassing.   Additional Social History:                         Sleep: Fair  Appetite:  Fair  Current Medications:  Current Facility-Administered Medications  Medication Dose Route Frequency Provider Last Rate Last Admin   acamprosate  (CAMPRAL ) tablet 666 mg  666 mg Oral TID Aeden Matranga, MD   666 mg at 12/10/23 9082   acetaminophen  (TYLENOL ) tablet 650 mg  650 mg Oral Q6H PRN Olasunkanmi, Oluwatosin, NP   650 mg at 12/10/23 9081   haloperidol  (HALDOL ) tablet 5 mg  5 mg Oral TID PRN Olasunkanmi, Oluwatosin, NP       And   diphenhydrAMINE  (BENADRYL ) capsule 50 mg  50 mg Oral TID PRN Olasunkanmi, Oluwatosin, NP       haloperidol  lactate (HALDOL ) injection 5 mg  5 mg Intramuscular TID PRN Olasunkanmi, Oluwatosin, NP       And   diphenhydrAMINE  (BENADRYL ) injection 50 mg  50 mg Intramuscular TID PRN Olasunkanmi, Oluwatosin, NP       And   LORazepam  (ATIVAN ) injection 2 mg  2 mg Intramuscular  TID PRN Olasunkanmi, Oluwatosin, NP       haloperidol  lactate (HALDOL ) injection 10 mg  10 mg Intramuscular TID PRN Olasunkanmi, Oluwatosin, NP       And   diphenhydrAMINE  (BENADRYL ) injection 50 mg  50 mg Intramuscular TID PRN Olasunkanmi, Oluwatosin, NP       And   LORazepam  (ATIVAN ) injection 2 mg  2 mg Intramuscular TID PRN Olasunkanmi, Oluwatosin, NP       divalproex  (DEPAKOTE  ER) 24 hr tablet 500 mg  500 mg Oral QHS Therisa Mennella, MD   500 mg at 12/09/23 2108   doxycycline  (VIBRA -TABS) tablet 100 mg  100 mg Oral Q12H Sivan Quast, MD   100 mg at 12/10/23 9082   EPINEPHrine  (EPI-PEN) injection 0.3 mg  0.3 mg Intramuscular PRN Kathlyn Leachman, MD       magnesium  hydroxide (MILK OF MAGNESIA) suspension 30 mL  30 mL Oral Daily PRN  Olasunkanmi, Oluwatosin, NP       meclizine  (ANTIVERT ) tablet 25 mg  25 mg Oral TID PRN Shamirah Ivan, MD       nicotine  (NICODERM CQ  - dosed in mg/24 hours) patch 21 mg  21 mg Transdermal Q0600 Olasunkanmi, Oluwatosin, NP   21 mg at 12/10/23 0620   QUEtiapine  (SEROQUEL ) tablet 50 mg  50 mg Oral QHS Pryor Guettler, MD   50 mg at 12/09/23 2109   thiamine  (VITAMIN B1) tablet 100 mg  100 mg Oral Daily Olasunkanmi, Oluwatosin, NP   100 mg at 12/10/23 9082   traZODone  (DESYREL ) tablet 50 mg  50 mg Oral QHS Olasunkanmi, Oluwatosin, NP   50 mg at 12/09/23 2108   Current Outpatient Medications  Medication Sig Dispense Refill   EPINEPHrine  0.3 mg/0.3 mL IJ SOAJ injection Inject 0.3 mg into the muscle as needed for anaphylaxis. (Patient not taking: Reported on 12/06/2023) 1 each 0    Labs  Lab Results:  Admission on 12/05/2023  Component Date Value Ref Range Status   WBC 12/05/2023 4.3  4.0 - 10.5 K/uL Final   RBC 12/05/2023 4.34  3.87 - 5.11 MIL/uL Final   Hemoglobin 12/05/2023 13.5  12.0 - 15.0 g/dL Final   HCT 92/91/7974 39.3  36.0 - 46.0 % Final   MCV 12/05/2023 90.6  80.0 - 100.0 fL Final   MCH 12/05/2023 31.1  26.0 - 34.0 pg Final   MCHC 12/05/2023 34.4  30.0 - 36.0 g/dL Final   RDW 92/91/7974 12.6  11.5 - 15.5 % Final   Platelets 12/05/2023 281  150 - 400 K/uL Final   nRBC 12/05/2023 0.0  0.0 - 0.2 % Final   Neutrophils Relative % 12/05/2023 41  % Final   Neutro Abs 12/05/2023 1.8  1.7 - 7.7 K/uL Final   Lymphocytes Relative 12/05/2023 47  % Final   Lymphs Abs 12/05/2023 2.0  0.7 - 4.0 K/uL Final   Monocytes Relative 12/05/2023 9  % Final   Monocytes Absolute 12/05/2023 0.4  0.1 - 1.0 K/uL Final   Eosinophils Relative 12/05/2023 2  % Final   Eosinophils Absolute 12/05/2023 0.1  0.0 - 0.5 K/uL Final   Basophils Relative 12/05/2023 1  % Final   Basophils Absolute 12/05/2023 0.0  0.0 - 0.1 K/uL Final   Immature Granulocytes 12/05/2023 0  % Final   Abs Immature Granulocytes 12/05/2023 0.01   0.00 - 0.07 K/uL Final   Performed at [facility], [address] [place], [place] [address]   Sodium 12/05/2023 134 (L)  135 -  145 mmol/L Final   Potassium 12/05/2023 4.6  3.5 - 5.1 mmol/L Final   Chloride 12/05/2023 97 (L)  98 - 111 mmol/L Final   CO2 12/05/2023 25  22 - 32 mmol/L Final   Glucose, Bld 12/05/2023 84  70 - 99 mg/dL Final   Glucose reference range applies only to samples taken after fasting for at least 8 hours.   BUN 12/05/2023 14  6 - 20 mg/dL Final   Creatinine, Ser 12/05/2023 0.80  0.44 - 1.00 mg/dL Final   Calcium  12/05/2023 9.4  8.9 - 10.3 mg/dL Final   Total Protein 92/91/7974 8.3 (H)  6.5 - 8.1 g/dL Final   Albumin 92/91/7974 4.0  3.5 - 5.0 g/dL Final   AST 92/91/7974 62 (H)  15 - 41 U/L Final   ALT 12/05/2023 47 (H)  0 - 44 U/L Final   Alkaline Phosphatase 12/05/2023 54  38 - 126 U/L Final   Total Bilirubin 12/05/2023 1.7 (H)  0.0 - 1.2 mg/dL Final   GFR, Estimated 12/05/2023 >60  >60 mL/min Final   Comment: (NOTE) Calculated using the CKD-EPI Creatinine Equation (2021)    Anion gap 12/05/2023 12  5 - 15 Final   Performed at [facility], [address] [place], [place] [address]   Hgb A1c MFr Bld 12/05/2023 5.1  4.8 - 5.6 % Final   Comment: (NOTE) Diagnosis of Diabetes The following HbA1c ranges recommended by the American Diabetes Association (ADA) may be used as an aid in the diagnosis of diabetes mellitus.  Hemoglobin             Suggested A1C NGSP%              Diagnosis  <5.7                   Non Diabetic  5.7-6.4                Pre-Diabetic  >6.4                   Diabetic  <7.0                   Glycemic control for                       adults with diabetes.     Mean Plasma Glucose 12/05/2023 99.67  mg/dL Final   Performed at [facility], [address] [place], [place] [address]   Magnesium  12/05/2023 1.6 (L)  1.7 - 2.4 mg/dL Final   Performed at [facility], [address] [place], [place] [address]    Alcohol, Ethyl (B) 12/05/2023 <15  <15 mg/dL Final   Comment: (NOTE) For medical purposes only. Performed at [facility], [address], [place], [place] [address]    Cholesterol 12/05/2023 271 (H)  0 - 200 mg/dL Final   Triglycerides 92/91/7974 42  <150 mg/dL Final   HDL 92/91/7974 >135  >40 mg/dL Final   Total CHOL/HDL Ratio 12/05/2023 NOT CALCULATED  RATIO Final   VLDL 12/05/2023 8  0 - 40 mg/dL Final   LDL Cholesterol 12/05/2023 NOT CALCULATED  0 - 99 mg/dL Final   Performed at [facility], [address], [place], [place] [address]   TSH 12/05/2023 1.957  0.350 - 4.500 uIU/mL Final   Comment: Performed by a 3rd Generation assay with a functional sensitivity of <=0.01 uIU/mL.  Performed at [facility], [address] [place], [place] [address]    POC Amphetamine UR 12/06/2023 None Detected  NONE DETECTED (Cut Off Level 1000 ng/mL) Final   POC Secobarbital (BAR) 12/06/2023 None Detected  NONE DETECTED (Cut Off Level 300 ng/mL) Final   POC Buprenorphine (BUP) 12/06/2023 None Detected  NONE DETECTED (Cut Off Level 10 ng/mL) Final   POC Oxazepam (BZO) 12/06/2023 Positive (A)  NONE DETECTED (Cut Off Level 300 ng/mL) Final   POC Cocaine UR 12/06/2023 Positive (A)  NONE DETECTED (Cut Off Level 300 ng/mL) Final   POC Methamphetamine UR 12/06/2023 None Detected  NONE DETECTED (Cut Off Level 1000 ng/mL) Final   POC Morphine  12/06/2023 None Detected  NONE DETECTED (Cut Off Level 300 ng/mL) Final   POC Methadone UR 12/06/2023 None Detected  NONE DETECTED (Cut Off Level 300 ng/mL) Final   POC Oxycodone  UR 12/06/2023 None Detected  NONE DETECTED (Cut Off Level 100 ng/mL) Final   POC Marijuana UR 12/06/2023 None Detected  NONE DETECTED (Cut Off Level 50 ng/mL) Final   Valproic Acid  Lvl 12/05/2023 <10 (L)  50 - 100 ug/mL Final   Comment: RESULTS CONFIRMED BY MANUAL DILUTION Performed at [facility], [address] [place], [place] [address]    Preg Test, Ur 12/06/2023  NEGATIVE  NEGATIVE Final   Comment:        THE SENSITIVITY OF THIS METHODOLOGY IS >20 mIU/mL.    WBC 12/10/2023 3.9 (L)  4.0 - 10.5 K/uL Final   RBC 12/10/2023 3.53 (L)  3.87 - 5.11 MIL/uL Final   Hemoglobin 12/10/2023 10.8 (L)  12.0 - 15.0 g/dL Final   HCT 92/86/7974 33.0 (L)  36.0 - 46.0 % Final   MCV 12/10/2023 93.5  80.0 - 100.0 fL Final   MCH 12/10/2023 30.6  26.0 - 34.0 pg Final   MCHC 12/10/2023 32.7  30.0 - 36.0 g/dL Final   RDW 92/86/7974 13.0  11.5 - 15.5 % Final   Platelets 12/10/2023 207  150 - 400 K/uL Final   nRBC 12/10/2023 0.0  0.0 - 0.2 % Final   Neutrophils Relative % 12/10/2023 38  % Final   Neutro Abs 12/10/2023 1.5 (L)  1.7 - 7.7 K/uL Final   Lymphocytes Relative 12/10/2023 47  % Final   Lymphs Abs 12/10/2023 1.8  0.7 - 4.0 K/uL Final   Monocytes Relative 12/10/2023 10  % Final   Monocytes Absolute 12/10/2023 0.4  0.1 - 1.0 K/uL Final   Eosinophils Relative 12/10/2023 3  % Final   Eosinophils Absolute 12/10/2023 0.1  0.0 - 0.5 K/uL Final   Basophils Relative 12/10/2023 1  % Final   Basophils Absolute 12/10/2023 0.0  0.0 - 0.1 K/uL Final   Immature Granulocytes 12/10/2023 1  % Final   Abs Immature Granulocytes 12/10/2023 0.05  0.00 - 0.07 K/uL Final   Performed at [facility], [address], [place], [place] [address]   Sodium 12/10/2023 135  135 - 145 mmol/L Final   Potassium 12/10/2023 3.9  3.5 - 5.1 mmol/L Final   Chloride 12/10/2023 103  98 - 111 mmol/L Final   CO2 12/10/2023 24  22 - 32 mmol/L Final   Glucose, Bld 12/10/2023 123 (H)  70 - 99 mg/dL Final   Glucose reference range applies only to samples taken after fasting for at least 8 hours.   BUN 12/10/2023 15  6 - 20 mg/dL Final   Creatinine, Ser 12/10/2023 0.75  0.44 -  1.00 mg/dL Final   Calcium  12/10/2023 9.0  8.9 - 10.3 mg/dL Final   Total Protein 92/86/7974 6.2 (L)  6.5 - 8.1 g/dL Final   Albumin 92/86/7974 2.9 (L)  3.5 - 5.0 g/dL Final   AST 92/86/7974 30  15 - 41 U/L Final   ALT  12/10/2023 25  0 - 44 U/L Final   Alkaline Phosphatase 12/10/2023 36 (L)  38 - 126 U/L Final   Total Bilirubin 12/10/2023 0.7  0.0 - 1.2 mg/dL Final   GFR, Estimated 12/10/2023 >60  >60 mL/min Final   Comment: (NOTE) Calculated using the CKD-EPI Creatinine Equation (2021)    Anion gap 12/10/2023 8  5 - 15 Final   Performed at [facility], [address], [place], [place] [address]   Valproic Acid  Lvl 12/10/2023 31 (L)  50 - 100 ug/mL Final   Performed at [facility], [address] [place], [place] [address]  Admission on 11/29/2023, Discharged on 11/30/2023  Component Date Value Ref Range Status   Alcohol, Ethyl (B) 11/29/2023 421 (HH)  <15 mg/dL Final   Comment: CRITICAL RESULT CALLED TO, READ BACK BY AND VERIFIED WITH JINNY LOMBARD RN  11/29/2023 2005 DAVISB (NOTE) For medical purposes only. Performed at [facility], [address] [place], [place] [address]    Sodium 11/29/2023 140  135 - 145 mmol/L Final   Potassium 11/29/2023 4.4  3.5 - 5.1 mmol/L Final   Chloride 11/29/2023 108  98 - 111 mmol/L Final   CO2 11/29/2023 22  22 - 32 mmol/L Final   Glucose, Bld 11/29/2023 119 (H)  70 - 99 mg/dL Final   Glucose reference range applies only to samples taken after fasting for at least 8 hours.   BUN 11/29/2023 9  6 - 20 mg/dL Final   Creatinine, Ser 11/29/2023 0.98  0.44 - 1.00 mg/dL Final   Calcium  11/29/2023 8.4 (L)  8.9 - 10.3 mg/dL Final   Total Protein 92/97/7974 7.6  6.5 - 8.1 g/dL Final   Albumin 92/97/7974 3.8  3.5 - 5.0 g/dL Final   AST 92/97/7974 99 (H)  15 - 41 U/L Final   ALT 11/29/2023 45 (H)  0 - 44 U/L Final   Alkaline Phosphatase 11/29/2023 43  38 - 126 U/L Final   Total Bilirubin 11/29/2023 0.9  0.0 - 1.2 mg/dL Final   GFR, Estimated 11/29/2023 >60  >60 mL/min Final   Comment: (NOTE) Calculated using the CKD-EPI Creatinine Equation (2021)    Anion gap 11/29/2023 10  5 - 15 Final   Performed at [facility], [address],  [place], [place] [address]   WBC 11/29/2023 7.0  4.0 - 10.5 K/uL Final   RBC 11/29/2023 3.75 (L)  3.87 - 5.11 MIL/uL Final   Hemoglobin 11/29/2023 11.6 (L)  12.0 - 15.0 g/dL Final   HCT 92/97/7974 33.8 (L)  36.0 - 46.0 % Final   MCV 11/29/2023 90.1  80.0 - 100.0 fL Final   MCH 11/29/2023 30.9  26.0 - 34.0 pg Final   MCHC 11/29/2023 34.3  30.0 - 36.0 g/dL Final   RDW 92/97/7974 12.3  11.5 - 15.5 % Final   Platelets 11/29/2023 241  150 - 400 K/uL Final   nRBC 11/29/2023 0.0  0.0 - 0.2 % Final   Neutrophils Relative % 11/29/2023 56  % Final   Neutro Abs 11/29/2023 4.0  1.7 - 7.7 K/uL Final   Lymphocytes Relative 11/29/2023 34  %  Final   Lymphs Abs 11/29/2023 2.4  0.7 - 4.0 K/uL Final   Monocytes Relative 11/29/2023 8  % Final   Monocytes Absolute 11/29/2023 0.6  0.1 - 1.0 K/uL Final   Eosinophils Relative 11/29/2023 1  % Final   Eosinophils Absolute 11/29/2023 0.1  0.0 - 0.5 K/uL Final   Basophils Relative 11/29/2023 1  % Final   Basophils Absolute 11/29/2023 0.1  0.0 - 0.1 K/uL Final   Immature Granulocytes 11/29/2023 0  % Final   Abs Immature Granulocytes 11/29/2023 0.02  0.00 - 0.07 K/uL Final   Performed at [facility], [address], [place], [place] [address]   Sodium 11/29/2023 141  135 - 145 mmol/L Final   Potassium 11/29/2023 4.4  3.5 - 5.1 mmol/L Final   Chloride 11/29/2023 110  98 - 111 mmol/L Final   BUN 11/29/2023 12  6 - 20 mg/dL Final   Creatinine, Ser 11/29/2023 1.40 (H)  0.44 - 1.00 mg/dL Final   Glucose, Bld 92/97/7974 115 (H)  70 - 99 mg/dL Final   Glucose reference range applies only to samples taken after fasting for at least 8 hours.   Calcium , Ion 11/29/2023 0.98 (L)  1.15 - 1.40 mmol/L Final   TCO2 11/29/2023 23  22 - 32 mmol/L Final   Hemoglobin 11/29/2023 12.2  12.0 - 15.0 g/dL Final   HCT 92/97/7974 36.0  36.0 - 46.0 % Final   Preg, Serum 11/29/2023 NEGATIVE  NEGATIVE Final   Comment:        THE SENSITIVITY OF THIS METHODOLOGY IS >10 mIU/mL. Performed  at [facility], [address], [place], [place] [address]   Admission on 09/19/2023, Discharged on 09/19/2023  Component Date Value Ref Range Status   Sodium 09/19/2023 134 (L)  135 - 145 mmol/L Final   Potassium 09/19/2023 4.3  3.5 - 5.1 mmol/L Final   Chloride 09/19/2023 102  98 - 111 mmol/L Final   CO2 09/19/2023 17 (L)  22 - 32 mmol/L Final   Glucose, Bld 09/19/2023 71  70 - 99 mg/dL Final   Glucose reference range applies only to samples taken after fasting for at least 8 hours.   BUN 09/19/2023 8  6 - 20 mg/dL Final   Creatinine, Ser 09/19/2023 0.77  0.44 - 1.00 mg/dL Final   Calcium  09/19/2023 8.6 (L)  8.9 - 10.3 mg/dL Final   Total Protein 95/77/7974 8.6 (H)  6.5 - 8.1 g/dL Final   Albumin 95/77/7974 3.7  3.5 - 5.0 g/dL Final   AST 95/77/7974 41  15 - 41 U/L Final   ALT 09/19/2023 37  0 - 44 U/L Final   Alkaline Phosphatase 09/19/2023 70  38 - 126 U/L Final   Total Bilirubin 09/19/2023 0.7  0.0 - 1.2 mg/dL Final   GFR, Estimated 09/19/2023 >60  >60 mL/min Final   Comment: (NOTE) Calculated using the CKD-EPI Creatinine Equation (2021)    Anion gap 09/19/2023 15  5 - 15 Final   Performed at [facility], [address], [place], [place] [address]   Alcohol, Ethyl (B) 09/19/2023 238 (H)  <15 mg/dL Final   Comment: (NOTE) For medical purposes only. Performed at [facility], [address] [place], [place] [address]    WBC 09/19/2023 8.4  4.0 - 10.5 K/uL Final   RBC 09/19/2023 4.29  3.87 - 5.11 MIL/uL Final   Hemoglobin 09/19/2023 13.3  12.0 - 15.0 g/dL Final   HCT  09/19/2023 41.6  36.0 - 46.0 % Final   MCV 09/19/2023 97.0  80.0 - 100.0 fL Final   MCH 09/19/2023 31.0  26.0 - 34.0 pg Final   MCHC 09/19/2023 32.0  30.0 - 36.0 g/dL Final   RDW 95/77/7974 13.1  11.5 - 15.5 % Final   Platelets 09/19/2023 261  150 - 400 K/uL Final   nRBC 09/19/2023 0.0  0.0 - 0.2 % Final   Performed at [facility], [address] [place], [place] [address]   Opiates 09/19/2023 NONE DETECTED  NONE DETECTED Final   Cocaine 09/19/2023 POSITIVE (A)  NONE DETECTED Final   Benzodiazepines 09/19/2023 NONE DETECTED  NONE DETECTED Final   Amphetamines 09/19/2023 POSITIVE (A)  NONE DETECTED Final   Comment: (NOTE) Trazodone  is metabolized in vivo to several metabolites, including pharmacologically active m-CPP, which is excreted in the urine. Immunoassay screens for amphetamines and MDMA have potential cross-reactivity with these compounds and may provide false positive  results.     Tetrahydrocannabinol 09/19/2023 POSITIVE (A)  NONE DETECTED Final   Barbiturates 09/19/2023 NONE DETECTED  NONE DETECTED Final   Comment: (NOTE) DRUG SCREEN FOR MEDICAL PURPOSES ONLY.  IF CONFIRMATION IS NEEDED FOR ANY PURPOSE, NOTIFY LAB WITHIN 5 DAYS.  LOWEST DETECTABLE LIMITS FOR URINE DRUG SCREEN Drug Class                     Cutoff (ng/mL) Amphetamine and metabolites    1000 Barbiturate and metabolites    200 Benzodiazepine                 200 Opiates and metabolites        300 Cocaine and metabolites        300 THC                            50 Performed at [facility], [address] [place], [place] [address]    Preg, Serum 09/19/2023 NEGATIVE  NEGATIVE Final   Comment:        THE SENSITIVITY OF THIS METHODOLOGY IS >10 mIU/mL. Performed at [facility], [address] [place], [place] [address]    SARS Coronavirus 2 by RT PCR 09/19/2023 NEGATIVE  NEGATIVE Final   Comment: (NOTE) SARS-CoV-2 target nucleic acids are NOT DETECTED.  The SARS-CoV-2 RNA is generally detectable in upper respiratory specimens during the acute phase of infection. The lowest concentration of SARS-CoV-2 viral copies this assay can detect is 138 copies/mL. A negative result does not preclude SARS-Cov-2 infection and should not be used as the sole basis for treatment or other patient management  decisions. A negative result may occur with  improper specimen collection/handling, submission of specimen other than nasopharyngeal swab, presence of viral mutation(s) within the areas targeted by this assay, and inadequate number of viral copies(<138 copies/mL). A negative result must be combined with clinical observations, patient history, and epidemiological information. The expected result is Negative.  Fact Sheet for Patients:  BloggerCourse.com  Fact Sheet for Healthcare Providers:  SeriousBroker.it  This test is no                          t yet approved or cleared by the [place]  FDA and  has been authorized for detection and/or diagnosis of SARS-CoV-2 by FDA under an Emergency Use Authorization (EUA). This EUA will remain  in  effect (meaning this test can be used) for the duration of the COVID-19 declaration under Section 564(b)(1) of the Act, 21 U.S.C.section 360bbb-3(b)(1), unless the authorization is terminated  or revoked sooner.       Influenza A by PCR 09/19/2023 NEGATIVE  NEGATIVE Final   Influenza B by PCR 09/19/2023 NEGATIVE  NEGATIVE Final   Comment: (NOTE) The Xpert Xpress SARS-CoV-2/FLU/RSV plus assay is intended as an aid in the diagnosis of influenza from Nasopharyngeal swab specimens and should not be used as a sole basis for treatment. Nasal washings and aspirates are unacceptable for Xpert Xpress SARS-CoV-2/FLU/RSV testing.  Fact Sheet for Patients: BloggerCourse.com  Fact Sheet for Healthcare Providers: SeriousBroker.it  This test is not yet approved or cleared by the [place]  FDA and has been authorized for detection and/or diagnosis of SARS-CoV-2 by FDA under an Emergency Use Authorization (EUA). This EUA will remain in effect (meaning this test can be used) for the duration of the COVID-19 declaration under Section 564(b)(1) of the Act,  21 U.S.C. section 360bbb-3(b)(1), unless the authorization is terminated or revoked.     Resp Syncytial Virus by PCR 09/19/2023 NEGATIVE  NEGATIVE Final   Comment: (NOTE) Fact Sheet for Patients: BloggerCourse.com  Fact Sheet for Healthcare Providers: SeriousBroker.it  This test is not yet approved or cleared by the [place]  FDA and has been authorized for detection and/or diagnosis of SARS-CoV-2 by FDA under an Emergency Use Authorization (EUA). This EUA will remain in effect (meaning this test can be used) for the duration of the COVID-19 declaration under Section 564(b)(1) of the Act, 21 U.S.C. section 360bbb-3(b)(1), unless the authorization is terminated or revoked.  Performed at [facility], [address] [place], [place] [address]   Admission on 07/19/2023, Discharged on 07/20/2023  Component Date Value Ref Range Status   Sodium 07/19/2023 136  135 - 145 mmol/L Final   Potassium 07/19/2023 3.3 (L)  3.5 - 5.1 mmol/L Final   Chloride 07/19/2023 101  98 - 111 mmol/L Final   CO2 07/19/2023 19 (L)  22 - 32 mmol/L Final   Glucose, Bld 07/19/2023 101 (H)  70 - 99 mg/dL Final   Glucose reference range applies only to samples taken after fasting for at least 8 hours.   BUN 07/19/2023 9  6 - 20 mg/dL Final   Creatinine, Ser 07/19/2023 0.69  0.44 - 1.00 mg/dL Final   Calcium  07/19/2023 9.0  8.9 - 10.3 mg/dL Final   Total Protein 97/80/7974 8.3 (H)  6.5 - 8.1 g/dL Final   Albumin 97/80/7974 3.7  3.5 - 5.0 g/dL Final   AST 97/80/7974 46 (H)  15 - 41 U/L Final   ALT 07/19/2023 22  0 - 44 U/L Final   Alkaline Phosphatase 07/19/2023 41  38 - 126 U/L Final   Total Bilirubin 07/19/2023 0.3  0.0 - 1.2 mg/dL Final   GFR, Estimated 07/19/2023 >60  >60 mL/min Final   Comment: (NOTE) Calculated using the CKD-EPI Creatinine Equation (2021)    Anion gap 07/19/2023 16 (H)  5 - 15 Final   Performed at [facility], [address], [place], [place] [address]   WBC 07/19/2023 5.3  4.0 - 10.5 K/uL Final   RBC 07/19/2023 3.97  3.87 - 5.11 MIL/uL Final   Hemoglobin 07/19/2023 12.3  12.0 - 15.0 g/dL Final   HCT 97/80/7974 37.3  36.0 - 46.0 % Final   MCV 07/19/2023 94.0  80.0 - 100.0 fL Final  MCH 07/19/2023 31.0  26.0 - 34.0 pg Final   MCHC 07/19/2023 33.0  30.0 - 36.0 g/dL Final   RDW 97/80/7974 12.2  11.5 - 15.5 % Final   Platelets 07/19/2023 292  150 - 400 K/uL Final   nRBC 07/19/2023 0.0  0.0 - 0.2 % Final   Performed at [facility], [address], [place], [place] [address]   Glucose-Capillary 07/19/2023 137 (H)  70 - 99 mg/dL Final   Glucose reference range applies only to samples taken after fasting for at least 8 hours.   Preg, Serum 07/19/2023 NEGATIVE  NEGATIVE Final   Comment:        THE SENSITIVITY OF THIS METHODOLOGY IS >10 mIU/mL. Performed at [facility], [address], [place], [place] [address]    Color, Urine 07/19/2023 STRAW (A)  YELLOW Final   APPearance 07/19/2023 CLEAR  CLEAR Final   Specific Gravity, Urine 07/19/2023 1.002 (L)  1.005 - 1.030 Final   pH 07/19/2023 5.0  5.0 - 8.0 Final   Glucose, UA 07/19/2023 NEGATIVE  NEGATIVE mg/dL Final   Hgb urine dipstick 07/19/2023 NEGATIVE  NEGATIVE Final   Bilirubin Urine 07/19/2023 NEGATIVE  NEGATIVE Final   Ketones, ur 07/19/2023 NEGATIVE  NEGATIVE mg/dL Final   Protein, ur 97/80/7974 NEGATIVE  NEGATIVE mg/dL Final   Nitrite 97/80/7974 NEGATIVE  NEGATIVE Final   Leukocytes,Ua 07/19/2023 NEGATIVE  NEGATIVE Final   Performed at [facility], [address], [place], [place]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

## 2023-12-10 NOTE — ED Notes (Signed)
 Patient A&Ox4. Denies intent to harm self/others when asked. Denies A/VH. Patient denies any physical complaints when asked. No acute distress noted. Support and encouragement provided. Routine safety checks conducted according to facility protocol. Encouraged patient to notify staff if thoughts of harm toward self or others arise. Patient verbalize understanding and agreement. Will continue to monitor for safety.

## 2023-12-10 NOTE — ED Notes (Signed)
 Pt sitting in dayroom watching television and interacting with peers. No acute distress noted. No concerns voiced. Informed pt to notify staff with any needs or assistance. Pt verbalized understanding and agreement. Will continue to monitor for safety.

## 2023-12-11 DIAGNOSIS — F319 Bipolar disorder, unspecified: Secondary | ICD-10-CM | POA: Diagnosis not present

## 2023-12-11 DIAGNOSIS — F1414 Cocaine abuse with cocaine-induced mood disorder: Secondary | ICD-10-CM | POA: Diagnosis not present

## 2023-12-11 DIAGNOSIS — F209 Schizophrenia, unspecified: Secondary | ICD-10-CM | POA: Diagnosis not present

## 2023-12-11 DIAGNOSIS — F1020 Alcohol dependence, uncomplicated: Secondary | ICD-10-CM | POA: Diagnosis not present

## 2023-12-11 MED ORDER — QUETIAPINE FUMARATE 50 MG PO TABS: 50.0000 mg | ORAL_TABLET | Freq: Every day | ORAL | 0 refills | Status: DC

## 2023-12-11 MED ORDER — DIVALPROEX SODIUM ER 500 MG PO TB24: 500.0000 mg | ORAL_TABLET | Freq: Every day | ORAL | 0 refills | Status: DC

## 2023-12-11 MED ORDER — ACAMPROSATE CALCIUM 333 MG PO TBEC: 666.0000 mg | DELAYED_RELEASE_TABLET | Freq: Three times a day (TID) | ORAL | 0 refills | Status: DC

## 2023-12-11 MED ORDER — MECLIZINE HCL 25 MG PO TABS: 25.0000 mg | ORAL_TABLET | Freq: Three times a day (TID) | ORAL | 0 refills | Status: DC | PRN

## 2023-12-11 MED ORDER — TRAZODONE HCL 50 MG PO TABS: 50.0000 mg | ORAL_TABLET | Freq: Every day | ORAL | 0 refills | Status: DC

## 2023-12-11 MED ORDER — DOXYCYCLINE HYCLATE 100 MG PO TABS: 100.0000 mg | ORAL_TABLET | Freq: Two times a day (BID) | ORAL | 0 refills | Status: DC

## 2023-12-11 MED ORDER — NICOTINE 21 MG/24HR TD PT24: 21.0000 mg | MEDICATED_PATCH | Freq: Every day | TRANSDERMAL | Status: AC

## 2023-12-11 NOTE — ED Notes (Signed)
 Pt observed lying in bed. Eyes closed respirations even and non labored. NAD q 15 minute observations continue for safety.

## 2023-12-11 NOTE — Group Note (Signed)
 Group Topic: Healthy Self Image and Positive Change  Group Date: 12/11/2023 Start Time: 1030 End Time: 1100 Facilitators: Lonzell Dwayne RAMAN, NT  Department: [facility]  Number of Participants: 7  Group Focus: daily focus Treatment Modality:  Patient-Centered Therapy Interventions utilized were patient education Purpose: increase insight  Name: Doris Gonzalez Date of Birth: 10/08/1985  MR: [id number]    Level of Participation: Patient attended groupactive Quality of Participation: cooperative Interactions with others: gave feedback Mood/Affect: positive Triggers (if applicable): N/A Cognition: coherent/clear Progress: Gaining insight Response: Appropriate  Plan: patient will be encouraged to work on herself more and ignoring what other say about her. 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

## 2023-12-11 NOTE — ED Notes (Signed)
 Pt reported doing well. She said, sometimes I have sleep disturbances. Denied SI/HI and AVH. She reported sweating night times. She rated her pain 4/10 and pain location is both legs. Med compliant. Nutrition intake adequate. BM remains as usual

## 2023-12-11 NOTE — ED Notes (Signed)
 Patient is sleeping no issue

## 2023-12-11 NOTE — ED Notes (Signed)
 Pt is for discharge today.  Pt reported that she would be able to go and stay with her sister if she had transportation as sister could not provide that for her  Arrangement have been made for patient  to receive cab voucher.  Followed up with patient.  She stated that sister said she could come, but sister had to go someone first.  Pt to call sister back to finalized discharge plan. Pt  has been calm and controlled NAD observed watching TV in day room. Q 15 minute observations for safety continue

## 2023-12-11 NOTE — ED Notes (Signed)
 Stable. A&O x 4.  Discharging to home/self care.    Prescriptions sent to patient preferred pharmacy.   Denies current SI plan and Intent.  Denies HI and A/V hallucinations.   All belongings returned to PT.  F/U instruction revied.  Pt verbalized understanding

## 2023-12-11 NOTE — Group Note (Signed)
 Group Topic: Overcoming Obstacles  Group Date: 12/11/2023 Start Time: 1930 End Time: 2000 Facilitators: Verdon Jacqualyn BRAVO, NT  Department: [facility]  Number of Participants: 9  Group Focus: feeling awareness/expression Treatment Modality:  Individual Therapy Interventions utilized were group exercise Purpose: trigger / craving management  Name: Doris Gonzalez Date of Birth: 20-Aug-1985  MR: [id number]    Level of Participation: active Quality of Participation: cooperative Interactions with others: gave feedback Mood/Affect: appropriate Triggers (if applicable): fighting, fall, getaway, thoughts of not living, beer Cognition: coherent/clear Progress: Moderate Response: n/a Plan: follow-up needed  Patients Problems:  Patient Active Problem List   Diagnosis Date Noted   Alcohol dependence with alcohol-induced mood disorder (HCC) 12/05/2023   Alcohol abuse 06/27/2016   Chronic hepatitis C without hepatic coma (HCC) 06/27/2016   Major depressive disorder without psychotic features 06/27/2016   Polysubstance abuse (HCC) 06/27/2016   

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
## 2024-01-23 ENCOUNTER — Emergency Department (HOSPITAL_COMMUNITY): Payer: MEDICAID

## 2024-01-23 ENCOUNTER — Emergency Department (HOSPITAL_COMMUNITY)
Admission: EM | Admit: 2024-01-23 | Discharge: 2024-01-23 | Disposition: A | Discharge status: Home / Self Care | Payer: MEDICAID | Attending: Emergency Medicine | Admitting: Emergency Medicine

## 2024-01-23 ENCOUNTER — Encounter (HOSPITAL_COMMUNITY): Payer: Self-pay

## 2024-01-23 ENCOUNTER — Other Ambulatory Visit: Payer: Self-pay

## 2024-01-23 DIAGNOSIS — W268XXA Contact with other sharp object(s), not elsewhere classified, initial encounter: Secondary | ICD-10-CM | POA: Insufficient documentation

## 2024-01-23 DIAGNOSIS — Z23 Encounter for immunization: Secondary | ICD-10-CM | POA: Insufficient documentation

## 2024-01-23 DIAGNOSIS — S71111A Laceration without foreign body, right thigh, initial encounter: Secondary | ICD-10-CM | POA: Diagnosis not present

## 2024-01-23 DIAGNOSIS — S79921A Unspecified injury of right thigh, initial encounter: Secondary | ICD-10-CM | POA: Diagnosis present

## 2024-01-23 MED ORDER — TETANUS-DIPHTH-ACELL PERTUSSIS 5-2.5-18.5 LF-MCG/0.5 IM SUSY
0.5000 mL | PREFILLED_SYRINGE | Freq: Once | INTRAMUSCULAR | Status: AC
  Administered 2024-01-23: 0.5 mL via INTRAMUSCULAR
  Filled 2024-01-23: qty 0.5

## 2024-01-23 MED ORDER — LIDOCAINE-EPINEPHRINE-TETRACAINE (LET) TOPICAL GEL
3.0000 mL | Freq: Once | TOPICAL | Status: AC
  Administered 2024-01-23: 3 mL via TOPICAL
  Filled 2024-01-23: qty 3

## 2024-01-23 MED ORDER — NAPROXEN 250 MG PO TABS
500.0000 mg | ORAL_TABLET | Freq: Once | ORAL | Status: AC
  Administered 2024-01-23: 500 mg via ORAL
  Filled 2024-01-23: qty 2

## 2024-01-23 MED ORDER — LIDOCAINE-EPINEPHRINE (PF) 2 %-1:200000 IJ SOLN
INTRAMUSCULAR | Status: AC
  Filled 2024-01-23: qty 20

## 2024-01-23 MED ORDER — HYDROCODONE-ACETAMINOPHEN 5-325 MG PO TABS
1.0000 | ORAL_TABLET | Freq: Once | ORAL | Status: AC
  Administered 2024-01-23: 1 via ORAL
  Filled 2024-01-23: qty 1

## 2024-01-23 MED ORDER — LIDOCAINE HCL (PF) 1 % IJ SOLN
INTRAMUSCULAR | Status: AC
  Filled 2024-01-23: qty 5
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
## 2024-01-23 NOTE — ED Notes (Signed)
 EDP at bedside
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
## 2024-01-23 NOTE — ED Notes (Signed)
 Patient transported to X-ray
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
## 2024-01-23 NOTE — Discharge Instructions (Addendum)
 Take tylenol  or ibuprofen  for pain. Return for staple removal in 10-12 days.

## 2024-01-23 NOTE — ED Notes (Signed)
 EDP at bedside. Lidocaine  given per verbal order.

## 2024-02-02 ENCOUNTER — Ambulatory Visit (HOSPITAL_COMMUNITY)
Admission: EM | Admit: 2024-02-02 | Discharge: 2024-02-02 | Disposition: A | Discharge status: Home / Self Care | Payer: MEDICAID | Attending: Emergency Medicine | Admitting: Emergency Medicine

## 2024-02-02 ENCOUNTER — Encounter (HOSPITAL_COMMUNITY): Payer: Self-pay

## 2024-02-02 DIAGNOSIS — L089 Local infection of the skin and subcutaneous tissue, unspecified: Secondary | ICD-10-CM | POA: Insufficient documentation

## 2024-02-02 DIAGNOSIS — S81801D Unspecified open wound, right lower leg, subsequent encounter: Secondary | ICD-10-CM | POA: Insufficient documentation

## 2024-02-02 DIAGNOSIS — Z113 Encounter for screening for infections with a predominantly sexual mode of transmission: Secondary | ICD-10-CM | POA: Diagnosis present

## 2024-02-02 DIAGNOSIS — T148XXA Other injury of unspecified body region, initial encounter: Secondary | ICD-10-CM | POA: Diagnosis present

## 2024-02-02 DIAGNOSIS — Z4802 Encounter for removal of sutures: Secondary | ICD-10-CM | POA: Insufficient documentation

## 2024-02-02 MED ORDER — KETOROLAC TROMETHAMINE 30 MG/ML IJ SOLN
INTRAMUSCULAR | Status: AC
  Filled 2024-02-02: qty 1

## 2024-02-02 MED ORDER — SULFAMETHOXAZOLE-TRIMETHOPRIM 800-160 MG PO TABS: 1.0000 | ORAL_TABLET | Freq: Two times a day (BID) | ORAL | 0 refills | Status: AC

## 2024-02-02 MED ORDER — KETOROLAC TROMETHAMINE 30 MG/ML IJ SOLN
30.0000 mg | Freq: Once | INTRAMUSCULAR | Status: AC
  Administered 2024-02-02: 30 mg via INTRAMUSCULAR

## 2024-02-02 MED ORDER — LIDOCAINE-EPINEPHRINE-TETRACAINE (LET) TOPICAL GEL
TOPICAL | Status: AC
  Filled 2024-02-02: qty 3

## 2024-02-02 MED ORDER — LIDOCAINE-EPINEPHRINE-TETRACAINE (LET) TOPICAL GEL
3.0000 mL | Freq: Once | TOPICAL | Status: AC
  Administered 2024-02-02: 3 mL via TOPICAL

## 2024-02-02 NOTE — ED Provider Notes (Signed)
 [facility]    CSN: [id number] Arrival date & time: 02/02/24  1208     History   Chief Complaint Chief Complaint  Patient presents with   Wound Check    HPI Doris Gonzalez is a 38 y.o. female.  Here with concerns for right upper thigh wound She had laceration repaired in the ED 8/26. 6 stapes were placed Tdap updated at that time. No abx given.   The stapes were meant to be removed today, 9/5 However she reports this morning some of them came out on their own She is concerned for drainage from the area Still having pain  Using tylenol  Has been cleaning with peroxide She denies picking or scratching at the area    She also is having vaginal discharge and wants STD testing    Alcohol use disorder and polysubstance abuse   Past Medical History:  Diagnosis Date   Alcoholism [facility])    Anxiety     Patient Active Problem List   Diagnosis Date Noted   Alcohol dependence with alcohol-induced mood disorder (HCC) 12/05/2023   Alcohol abuse 06/27/2016   Chronic hepatitis C without hepatic coma (HCC) 06/27/2016   Major depressive disorder without psychotic features 06/27/2016   Polysubstance abuse (HCC) 06/27/2016   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      Home Medications    Prior to Admission medications   Medication Sig Start Date End Date Taking? Authorizing Provider  sulfamethoxazole -trimethoprim  (BACTRIM  DS) 800-160 MG tablet Take 1 tablet by mouth 2 (two) times daily for 10 days. 02/02/24 02/12/24 Yes Jocelyne Reinertsen, [place], PA-C  acamprosate  (CAMPRAL ) 333 MG tablet Take 2 tablets (666 mg total) by mouth 3 (three) times daily. 12/11/23   Tex Drilling, NP  divalproex  (DEPAKOTE  ER) 500 MG 24 hr tablet Take 1 tablet (500 mg total) by mouth at bedtime. 12/11/23   Tex Drilling, NP  EPINEPHrine  0.3  mg/0.3 mL IJ SOAJ injection Inject 0.3 mg into the muscle as needed for anaphylaxis. Patient not taking: Reported on 12/06/2023 10/14/23   Pamella Ozell LABOR, DO  meclizine  (ANTIVERT ) 25 MG tablet Take 1 tablet (25 mg total) by mouth 3 (three) times daily as needed for dizziness. 12/11/23   Tex Drilling, NP  nicotine  (NICODERM CQ  - DOSED IN MG/24 HOURS) 21 mg/24hr patch Place 1 patch (21 mg total) onto the skin daily at 6 (six) AM. 12/12/23   Tex Drilling, NP  QUEtiapine  (SEROQUEL ) 50 MG tablet Take 1 tablet (50 mg total) by mouth at bedtime. 12/11/23   Tex Drilling, NP  traZODone  (DESYREL ) 50 MG tablet Take 1 tablet (50 mg total) by mouth at bedtime. 12/11/23   Tex Drilling, NP    Family History Family History  Problem Relation Age of Onset   Hypertension Other    Diabetes Other    Diabetes Mother    Hypertension Mother    Heart failure Father     Social History Social History   Tobacco Use   Smoking status: Every Day    Current packs/day: 0.50    Types: Cigarettes   Smokeless tobacco: Never  Vaping Use   Vaping status: Never Used  Substance Use Topics   Alcohol use: Yes    Comment: alcoholic   Drug use: No     Allergies   Shellfish allergy  Review of Systems Review of Systems  As per HPI  Physical Exam Triage Vital Signs ED Triage Vitals  Encounter Vitals Group     BP 02/02/24 1344 (!) 140/87     Girls Systolic BP Percentile --      Girls Diastolic BP Percentile --      Boys Systolic BP Percentile --      Boys Diastolic BP Percentile --      Pulse Rate 02/02/24 1344 83     Resp 02/02/24 1344 18     Temp 02/02/24 1344 98.7 F (37.1 C)     Temp Source 02/02/24 1344 Oral     SpO2 02/02/24 1344 99 %     Weight --      Height --      Head Circumference --      Peak Flow --      Pain Score 02/02/24 1345 5     Pain Loc --      Pain Education --      Exclude from Growth Chart --    No data found.  Updated Vital Signs BP (!) 140/87 (BP Location: Right  Arm)   Pulse 83   Temp 98.7 F (37.1 C) (Oral)   Resp 18   LMP  (LMP Unknown)   SpO2 99%   Visual Acuity Right Eye Distance:   Left Eye Distance:   Bilateral Distance:    Right Eye Near:   Left Eye Near:    Bilateral Near:     Physical Exam Vitals and nursing note reviewed.  Constitutional:      General: She is not in acute distress.    Appearance: She is not diaphoretic.  HENT:     Mouth/Throat:     Mouth: Mucous membranes are moist.     Pharynx: Oropharynx is clear.  Eyes:     Conjunctiva/sclera: Conjunctivae normal.  Cardiovascular:     Rate and Rhythm: Normal rate and regular rhythm.     Pulses: Normal pulses.     Heart sounds: Normal heart sounds.  Pulmonary:     Effort: Pulmonary effort is normal.     Breath sounds: Normal breath sounds.  Musculoskeletal:     Comments: Full ROM hip, knee, ankle. Distal sensation intact. Strength intact  Skin:    Findings: Wound present.     Comments: Wound on right inner thigh. It is wide open. Tender with induration. Yellow drainage noted. There are 5 staples still in the skin, however they are not in place. See attached photo  Neurological:     Mental Status: She is alert and oriented to person, place, and time.  Psychiatric:        Mood and Affect: Mood is anxious.        Speech: Speech normal.        Behavior: Behavior is hyperactive.       UC Treatments / Results  Labs (all labs ordered are listed, but only abnormal results are displayed) Labs Reviewed  CERVICOVAGINAL ANCILLARY ONLY    EKG  Radiology No results found.  Procedures Procedures   Medications Ordered in UC Medications  ketorolac  (TORADOL ) 30 MG/ML injection 30 mg (30 mg Intramuscular Given 02/02/24 1422)  lidocaine -EPINEPHrine -tetracaine  (LET) topical gel (3 mLs Topical Given 02/02/24 1420)    Initial Impression / Assessment and Plan / UC Course  I have reviewed the triage vital signs and the nursing notes.  Pertinent labs & imaging results  that were available during my care  of the patient were reviewed by me and considered in my medical decision making (see chart for details).  Wound is not at all approximated.  There are 5 stapes abnormally placed throughout. Patient thinks only one fell out.  IM toradol  given for pain, and LET gel applied topically to wound  This provided some pain relief. Five staples removed by this provider. Only one required a staple remover, the other 4 are lifted off the wound with forceps. Further examination of the wound reveals no further staples or foreign body. Will cover with Bactrim  BID x 10 days. Discussed appropriate use of medication.  Ibuprofen  for pain. Keeping clean with soap and water. Advised against no further peroxide use.  She does not have a PCP. I have advised return to this clinic in 4 days if not improving, otherwise be seen in the ED if at all worsening.    Additionally cytology swab is pending. Treat positive as indicated  Final Clinical Impressions(s) / UC Diagnoses   Final diagnoses:  Wound of right lower extremity, subsequent encounter  Wound infection  Encounter for staple removal  Screen for STD (sexually transmitted disease)     Discharge Instructions      Please take antibiotic BACTRIM  as prescribed. Twice daily for 10 days in a row.  Take with food to avoid upset stomach. Finish ALL of the pills - you should not have any leftover.   Keep a dressing (gauze) over the wound to protect it.  Wash it daily with soap and water Do NOT use any alcohol or peroxide!!  Return in about 4 days if there is no improvement in pain and drainage with the antibiotic. The wound itself may not start to heal over for a month or so, but the signs of infection should be treated.   Please go back to the emergency department if symptoms are worsening.   We will call you if anything on your swab returns positive. You can also see these results on MyChart. Please abstain from sexual  intercourse until your results return.     ED Prescriptions     Medication Sig Dispense Auth. Provider   sulfamethoxazole -trimethoprim  (BACTRIM  DS) 800-160 MG tablet Take 1 tablet by mouth 2 (two) times daily for 10 days. 20 tablet Darely Becknell, Asberry, PA-C      PDMP not reviewed this encounter.   Jeryl Asberry, PA-C 02/02/24 1539

## 2024-02-02 NOTE — ED Triage Notes (Signed)
 Pt had a wound to rt upper thigh stapled from ED last week. States sometime this morning the staples came out and its draining. States took tylenol  with no relief.

## 2024-02-02 NOTE — Discharge Instructions (Addendum)
 Please take antibiotic BACTRIM  as prescribed. Twice daily for 10 days in a row.  Take with food to avoid upset stomach. Finish ALL of the pills - you should not have any leftover.   Keep a dressing (gauze) over the wound to protect it.  Wash it daily with soap and water Do NOT use any alcohol or peroxide!!  Return in about 4 days if there is no improvement in pain and drainage with the antibiotic. The wound itself may not start to heal over for a month or so, but the signs of infection should be treated.   Please go back to the emergency department if symptoms are worsening.   We will call you if anything on your swab returns positive. You can also see these results on MyChart. Please abstain from sexual intercourse until your results return.

## 2024-02-05 ENCOUNTER — Ambulatory Visit (HOSPITAL_COMMUNITY): Payer: Self-pay

## 2024-02-05 LAB — CERVICOVAGINAL ANCILLARY ONLY
Bacterial Vaginitis (gardnerella): POSITIVE — AB
Candida Glabrata: NEGATIVE
Candida Vaginitis: NEGATIVE
Chlamydia: NEGATIVE
Comment: NEGATIVE
Comment: NEGATIVE
Comment: NEGATIVE
Comment: NEGATIVE
Comment: NEGATIVE
Comment: NORMAL
Neisseria Gonorrhea: NEGATIVE
Trichomonas: POSITIVE — AB

## 2024-02-05 MED ORDER — METRONIDAZOLE 500 MG PO TABS: 500.0000 mg | ORAL_TABLET | Freq: Two times a day (BID) | ORAL | 0 refills | Status: AC

## 2024-02-09 NOTE — Telephone Encounter (Signed)
Unable to send letter. No address on file.

## 2024-02-14 ENCOUNTER — Emergency Department (HOSPITAL_COMMUNITY)
Admission: EM | Admit: 2024-02-14 | Discharge: 2024-02-14 | Disposition: A | Discharge status: Home / Self Care | Payer: MEDICAID | Source: Ambulatory Visit

## 2024-02-14 ENCOUNTER — Other Ambulatory Visit: Payer: Self-pay

## 2024-02-14 ENCOUNTER — Ambulatory Visit: Payer: Self-pay

## 2024-02-14 DIAGNOSIS — T8140XA Infection following a procedure, unspecified, initial encounter: Secondary | ICD-10-CM | POA: Insufficient documentation

## 2024-02-14 DIAGNOSIS — Y838 Other surgical procedures as the cause of abnormal reaction of the patient, or of later complication, without mention of misadventure at the time of the procedure: Secondary | ICD-10-CM | POA: Insufficient documentation

## 2024-02-14 DIAGNOSIS — L089 Local infection of the skin and subcutaneous tissue, unspecified: Secondary | ICD-10-CM

## 2024-02-14 MED ORDER — CEPHALEXIN 500 MG PO CAPS: 500.0000 mg | ORAL_CAPSULE | Freq: Three times a day (TID) | ORAL | 0 refills | Status: AC

## 2024-02-14 NOTE — Discharge Instructions (Addendum)
 Apply warm compresses to area for 20 minutes three times daily. Take antibiotics as prescribed and complete the full course. Recheck with your doctor in 2 days.

## 2024-02-14 NOTE — Telephone Encounter (Signed)
 FYI Only or Action Required?: Action required by provider: request for appointment.  Patient was last seen in primary care on na.  Called Nurse Triage reporting Wound Infection.  Symptoms began several days ago.  Interventions attempted: Other: antibiotics, lost meds.  Symptoms are: gradually worsening.  Triage Disposition: See Physician Within 24 Hours  Patient/caregiver understands and will follow disposition?: Yes    Copied from CRM #[id number]. Topic: Clinical - Red Word Triage >> Feb 14, 2024 12:26 PM Rosaria BRAVO wrote: Red Word that prompted transfer to Nurse Triage: Open wound, infected, painful, on leg.   Caller Dr. Beth Gambrell    ----------------------------------------------------------------------- From previous Reason for Contact - Scheduling: Patient/patient representative is calling to schedule an appointment. Refer to attachments for appointment information. Reason for Disposition  [1] Taking antibiotic > 72 hours (3 days) AND [2] infected wound not improved (i.e., pain, pus, redness)  Answer Assessment - Initial Assessment Questions Advised UC/ED today. Patient reports going back to UC.  Scheduled new pt appt, 03/25/24; informed new appt does not address acute needs and advised UC/ED today. Patient verbalized understanding.  Antibiotic, lost pills last night, Monday last took medication. Dr. Josefine, pt psych therapist, Med Management  1. LOCATION: Where is the wound located?      Right leg, inner thigh;injury,   2. WOUND APPEARANCE: What does the wound look like?      black spot and pus; open, drainage, yellow, gray spot, around the wound black, black grayish, redness in the inner part, burns. Denies red streaks, warm to touch, odors, fever, chills, n/v, HA, dizziness, faint  Took antibiotics, but lost the pills.  4. SPREAD: What's changed in the last day?  Do you see any red streaks coming from the wound?     no 5. ONSET: When did it start to  look infected?      UC visit 02/02/24 6. MECHANISM: How did the wound start, what was the cause?     Fell on metal rusty rod, received technical shot 7. PAIN: Is there any pain? If Yes, ask: How bad is the pain?   (Scale 1-10; or mild, moderate, severe)     6/10 8. FEVER: Do you have a fever? If Yes, ask: What is your temperature, how was it measured, and when did it start?     no 9. OTHER SYMPTOMS: Do you have any other symptoms? (e.g., shaking chills, weakness, rash elsewhere on body)     denies  Protocols used: Wound Infection-A-AH

## 2024-02-14 NOTE — ED Provider Notes (Signed)
 [place] [facility] Provider Note   CSN: [id number] Arrival date & time: 02/14/24  1332     Patient presents with: Wound Check   Doris Gonzalez is a 38 y.o. female.   38 yo female with right thigh wound. Patient had staples removed, wound opened. She lost her antibiotic prescription and is concerned the area is infected, no fevers, no active drainage.        Prior to Admission medications   Medication Sig Start Date End Date Taking? Authorizing Provider  cephALEXin  (KEFLEX ) 500 MG capsule Take 1 capsule (500 mg total) by mouth 3 (three) times daily for 7 days. 02/14/24 02/21/24 Yes Beverley Leita LABOR, PA-C  acamprosate  (CAMPRAL ) 333 MG tablet Take 2 tablets (666 mg total) by mouth 3 (three) times daily. 12/11/23   Tex Drilling, NP  divalproex  (DEPAKOTE  ER) 500 MG 24 hr tablet Take 1 tablet (500 mg total) by mouth at bedtime. 12/11/23   Tex Drilling, NP  EPINEPHrine  0.3 mg/0.3 mL IJ SOAJ injection Inject 0.3 mg into the muscle as needed for anaphylaxis. Patient not taking: Reported on 12/06/2023 10/14/23   Pamella Ozell LABOR, DO  meclizine  (ANTIVERT ) 25 MG tablet Take 1 tablet (25 mg total) by mouth 3 (three) times daily as needed for dizziness. 12/11/23   Tex Drilling, NP  nicotine  (NICODERM CQ  - DOSED IN MG/24 HOURS) 21 mg/24hr patch Place 1 patch (21 mg total) onto the skin daily at 6 (six) AM. 12/12/23   Tex Drilling, NP  QUEtiapine  (SEROQUEL ) 50 MG tablet Take 1 tablet (50 mg total) by mouth at bedtime. 12/11/23   Tex Drilling, NP  traZODone  (DESYREL ) 50 MG tablet Take 1 tablet (50 mg total) by mouth at bedtime. 12/11/23   Tex Drilling, NP    Allergies: Shellfish allergy    Review of Systems Negative except as per HPI Updated Vital Signs BP (!) 149/111   Pulse (!) 104   Temp 98.6 F (37 C)   Resp 16   LMP  (LMP Unknown)   SpO2 98%   Physical Exam Vitals and nursing note reviewed.  Constitutional:      General: She is not in acute  distress.    Appearance: She is well-developed. She is not diaphoretic.  HENT:     Head: Normocephalic and atraumatic.  Pulmonary:     Effort: Pulmonary effort is normal.  Skin:    General: Skin is warm and dry.     Comments: Approx 3cm x 2cm wound to right medial thigh, no active drainage, does have surrounding induration without fluctuance.   Neurological:     Mental Status: She is alert and oriented to person, place, and time.  Psychiatric:        Behavior: Behavior normal.     (all labs ordered are listed, but only abnormal results are displayed) Labs Reviewed - No data to display  EKG: None  Radiology: No results found.   Procedures   Medications Ordered in the ED - No data to display                                  Medical Decision Making Risk Prescription drug management.  38 year old female with wound to right inner thigh.  Induration, no active drainage or fluctuance.  Recommend Keflex .  Warm compresses.  Recheck with PCP in 2 days.      Final diagnoses:  Wound infection  ED Discharge Orders          Ordered    cephALEXin  (KEFLEX ) 500 MG capsule  3 times daily        02/14/24 1343               Beverley Leita LABOR, PA-C 02/14/24 1345    Ula Prentice SAUNDERS, MD 02/14/24 [phone]

## 2024-02-14 NOTE — ED Triage Notes (Signed)
 Patient arrives with wound to R thigh. Staples removed but she's concerned it's infected. She was on abx but she has lost the rest of the rx.

## 2024-03-20 ENCOUNTER — Encounter: Payer: Self-pay | Admitting: Family Medicine

## 2024-03-20 ENCOUNTER — Ambulatory Visit (INDEPENDENT_AMBULATORY_CARE_PROVIDER_SITE_OTHER): Payer: MEDICAID | Admitting: Family Medicine

## 2024-03-20 VITALS — BP 126/82 | HR 63 | Ht 62.0 in | Wt 129.2 lb

## 2024-03-20 DIAGNOSIS — A64 Unspecified sexually transmitted disease: Secondary | ICD-10-CM | POA: Diagnosis not present

## 2024-03-20 DIAGNOSIS — F329 Major depressive disorder, single episode, unspecified: Secondary | ICD-10-CM

## 2024-03-20 MED ORDER — MECLIZINE HCL 25 MG PO TABS: 25.0000 mg | ORAL_TABLET | Freq: Three times a day (TID) | ORAL | 0 refills | Status: AC | PRN

## 2024-03-20 MED ORDER — DIVALPROEX SODIUM ER 500 MG PO TB24: 500.0000 mg | ORAL_TABLET | Freq: Every day | ORAL | 0 refills | Status: AC

## 2024-03-20 MED ORDER — TRAZODONE HCL 50 MG PO TABS: 50.0000 mg | ORAL_TABLET | Freq: Every day | ORAL | 0 refills | Status: AC

## 2024-03-20 MED ORDER — ACAMPROSATE CALCIUM 333 MG PO TBEC: 666.0000 mg | DELAYED_RELEASE_TABLET | Freq: Three times a day (TID) | ORAL | 0 refills | Status: AC

## 2024-03-20 MED ORDER — QUETIAPINE FUMARATE 50 MG PO TABS: 50.0000 mg | ORAL_TABLET | Freq: Every day | ORAL | 0 refills | Status: AC

## 2024-03-20 NOTE — Progress Notes (Signed)
 Name: Doris Gonzalez   Date of Visit: 03/20/24   Date of last visit with me: Visit date not found   CHIEF COMPLAINT:  Chief Complaint  Patient presents with   Establish Care    Establish care. Wants hands and legs to be checked on and see about being cleared. Needs to be cleared in writing for a job. Out of medications. Wants to be checked for STD and BV       HPI:  Discussed the use of AI scribe software for clinical note transcription with the patient, who gave verbal consent to proceed.  History of Present Illness   Doris Gonzalez is a 38 year old female who presents for clearance to return to work following a hand injury and reports tingling in her hand.  She experiences tingling in her hand, described as feeling like 'thorns or something.' The tingling occurs primarily when it is cold or when her hand is wet, lasting approximately five minutes. The sensation is confined to her hands and does not radiate elsewhere. She smokes less than a pack of cigarettes daily.  She mentions a previous injury from a fall last month, which is the reason for her work clearance requirement. She observes a 'little black spot' on the injury site and questions its significance. The injury was deep and is now thick and rubbery.  She reports a long-standing leg injury that causes discomfort in cold weather, leading to a limp. She takes naproxen  for relief and requests a refill.  She has been off all her medications, including Depakote , and is seeking refills.  She also reports having a cough, which she attributes to smoking.         OBJECTIVE:       03/20/2024    2:36 PM  Depression screen PHQ 2/9  Decreased Interest 0  Down, Depressed, Hopeless 1  PHQ - 2 Score 1     BP Readings from Last 3 Encounters:  03/20/24 126/82  02/14/24 (!) 149/111  02/02/24 (!) 140/87    BP 126/82   Pulse 63   Ht 5' 2 (1.575 m)   Wt 129 lb 3.2 oz (58.6 kg)   SpO2 98%   BMI 23.63 kg/m    Physical  Exam   SKIN: Wound healing well. Scar with thick, rubbery texture.      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 - Advise smoking cessation to prevent further vascular complications. - Advised keeping hands warm during colder months.   Chronic tobacco use Chronic tobacco use contributing to peripheral vascular disease and cough. She desires to quit smoking. - Advise smoking cessation to improve vascular health and reduce cough.  Cough likely related to smoking Cough likely related to chronic tobacco use. She acknowledges the need to quit smoking. - Advise smoking cessation to reduce cough.  Healing hand and leg wounds Healing wounds on hand and leg. Hand wound has a black spot healing well. Leg wound is older, larger, and causes discomfort in cold weather. Both will scar due to depth. - Provide clearance note for work without restrictions. - Advise that the wounds will scar and may thicken over time.  Sexually transmitted disease screening Requires STD screening as part of routine health maintenance. - Provide self-swab for STD testing.  Major depression disorder -Refilled all meds inlcuding depakoke, campral , seroquel  and meclizine . Advised patient on adherence to medication and requesting refills so she does not run out of medications again.      Kourtlynn Trevor A. Vita MD [facility]

## 2024-03-22 ENCOUNTER — Encounter: Payer: Self-pay | Admitting: Family Medicine

## 2024-03-22 ENCOUNTER — Ambulatory Visit: Payer: Self-pay | Admitting: Family Medicine

## 2024-03-22 DIAGNOSIS — B9689 Other specified bacterial agents as the cause of diseases classified elsewhere: Secondary | ICD-10-CM

## 2024-03-22 MED ORDER — METRONIDAZOLE 0.75 % VA GEL: 1.0000 | Freq: Two times a day (BID) | VAGINAL | 0 refills | Status: AC

## 2024-03-23 LAB — NUSWAB VAGINITIS PLUS (VG+)
Atopobium vaginae: HIGH {score} — AB
BVAB 2: HIGH {score} — AB
Candida albicans, NAA: NEGATIVE
Candida glabrata, NAA: NEGATIVE
Chlamydia trachomatis, NAA: NEGATIVE
Neisseria gonorrhoeae, NAA: NEGATIVE
Trich vag by NAA: POSITIVE — AB

## 2024-03-25 ENCOUNTER — Other Ambulatory Visit: Payer: MEDICAID

## 2024-03-25 ENCOUNTER — Ambulatory Visit: Payer: MEDICAID | Admitting: Family Medicine

## 2024-04-01 ENCOUNTER — Ambulatory Visit: Payer: MEDICAID | Admitting: Family Medicine

## 2024-04-09 ENCOUNTER — Encounter (HOSPITAL_COMMUNITY): Payer: Self-pay

## 2024-04-09 ENCOUNTER — Emergency Department (HOSPITAL_COMMUNITY): Admission: EM | Admit: 2024-04-09 | Discharge: 2024-04-10 | Discharge status: Against Medical Advice | Payer: MEDICAID

## 2024-04-09 ENCOUNTER — Emergency Department (HOSPITAL_COMMUNITY): Payer: MEDICAID

## 2024-04-09 ENCOUNTER — Other Ambulatory Visit: Payer: Self-pay

## 2024-04-09 DIAGNOSIS — M79604 Pain in right leg: Secondary | ICD-10-CM | POA: Diagnosis present

## 2024-04-09 DIAGNOSIS — Z5321 Procedure and treatment not carried out due to patient leaving prior to being seen by health care provider: Secondary | ICD-10-CM | POA: Diagnosis not present

## 2024-04-09 NOTE — ED Provider Triage Note (Signed)
 Emergency Medicine Provider Triage Evaluation Note  Doris Gonzalez , a 38 y.o. female  was evaluated in triage.  Pt complains of right leg pain. Patient appears intoxicated in triage and is poor historian. Per chart review looks like patient has had a recent wound to R thigh area and had staples, patient saying wound is hurting. Wound does not appear infected in triage.   Review of Systems  Positive: R leg pain Negative: Unable to obtain other review of symptoms due to presumed intoxication  Physical Exam  BP (!) 159/135 (BP Location: Right Arm)   Pulse (!) 102   Temp 98.2 F (36.8 C)   Resp (!) 22   Ht 5' (1.524 m)   Wt 56.7 kg   SpO2 99%   BMI 24.41 kg/m  Gen:   Awake, no distress   Resp:  Normal effort  MSK:   Moves extremities without difficulty  Other:  Healing wound present to R thigh, no surrounding erythema or drainage  Medical Decision Making  Medically screening exam initiated at 7:23 PM.  Appropriate orders placed.  Doris Gonzalez was informed that the remainder of the evaluation will be completed by another provider, this initial triage assessment does not replace that evaluation, and the importance of remaining in the ED until their evaluation is complete.  Orders: CBC, CMP, xray of right thigh, ethanol, pregnancy   Doris Gonzalez, [place] 04/09/24 1926

## 2024-04-09 NOTE — ED Triage Notes (Signed)
 Presents with c/o leg pain. H/o same. Describes as acute and chronic. Seen previousl for the same. Appears intoxicated. Alert, NAD, calm, interactive, resps e/u, speaking in clear complete sentences. Skin W&D. Steady gait.

## 2024-04-09 NOTE — ED Notes (Signed)
 Pt not cooperative when attempted to draw blood.

## 2024-06-22 DIAGNOSIS — F10920 Alcohol use, unspecified with intoxication, uncomplicated: Secondary | ICD-10-CM | POA: Diagnosis not present

## 2024-06-22 DIAGNOSIS — S0592XA Unspecified injury of left eye and orbit, initial encounter: Secondary | ICD-10-CM | POA: Diagnosis present

## 2024-06-22 DIAGNOSIS — Y92480 Sidewalk as the place of occurrence of the external cause: Secondary | ICD-10-CM | POA: Insufficient documentation

## 2024-06-22 DIAGNOSIS — R799 Abnormal finding of blood chemistry, unspecified: Secondary | ICD-10-CM | POA: Diagnosis not present

## 2024-06-22 DIAGNOSIS — Y908 Blood alcohol level of 240 mg/100 ml or more: Secondary | ICD-10-CM | POA: Diagnosis not present

## 2024-06-22 DIAGNOSIS — S0012XA Contusion of left eyelid and periocular area, initial encounter: Secondary | ICD-10-CM | POA: Insufficient documentation

## 2024-06-22 DIAGNOSIS — S0081XA Abrasion of other part of head, initial encounter: Secondary | ICD-10-CM | POA: Diagnosis not present

## 2024-06-22 DIAGNOSIS — S00511A Abrasion of lip, initial encounter: Secondary | ICD-10-CM | POA: Diagnosis not present

## 2024-06-22 DIAGNOSIS — W19XXXA Unspecified fall, initial encounter: Secondary | ICD-10-CM | POA: Insufficient documentation

## 2024-06-22 DIAGNOSIS — S0990XA Unspecified injury of head, initial encounter: Secondary | ICD-10-CM | POA: Insufficient documentation

## 2024-06-22 DIAGNOSIS — S0031XA Abrasion of nose, initial encounter: Secondary | ICD-10-CM | POA: Insufficient documentation

## 2024-06-23 MED ADMIN — Acetaminophen Tab 325 MG: 650 mg | ORAL | NDC 50580047511

## 2024-06-23 MED ADMIN — Haloperidol Lactate Inj 5 MG/ML: 5 mg | INTRAMUSCULAR | NDC 67457042600

## 2024-06-23 MED ADMIN — Ibuprofen Tab 200 MG: 600 mg | ORAL | NDC 60687044611

## 2024-06-23 MED ADMIN — Sodium Chloride IV Soln 0.9%: 1000 mL | INTRAVENOUS | NDC 00264580200

## 2024-06-23 MED ADMIN — Lorazepam Inj 2 MG/ML: 1 mg | INTRAMUSCULAR | NDC 00641604401
